# Patient Record
Sex: Male | Born: 1985 | Race: White | Hispanic: No | Marital: Married | Smoking: Never smoker
Health system: Southern US, Community
[De-identification: ages and names within clinical notes are randomized; demographics above are authoritative.]

## PROBLEM LIST (undated history)

## (undated) DIAGNOSIS — I10 Essential (primary) hypertension: Secondary | ICD-10-CM

## (undated) DIAGNOSIS — E119 Type 2 diabetes mellitus without complications: Secondary | ICD-10-CM

## (undated) DIAGNOSIS — E7800 Pure hypercholesterolemia, unspecified: Secondary | ICD-10-CM

## (undated) HISTORY — PX: ANKLE SURGERY: SHX546

---

## 2017-06-17 ENCOUNTER — Encounter (HOSPITAL_COMMUNITY): Payer: Self-pay | Admitting: Emergency Medicine

## 2017-06-17 ENCOUNTER — Emergency Department (HOSPITAL_COMMUNITY): Payer: Medicaid - Out of State

## 2017-06-17 ENCOUNTER — Emergency Department (HOSPITAL_COMMUNITY)
Admission: EM | Admit: 2017-06-17 | Discharge: 2017-06-17 | Discharge status: Against Medical Advice | Payer: Self-pay | Attending: Emergency Medicine | Admitting: Emergency Medicine

## 2017-06-17 ENCOUNTER — Emergency Department (HOSPITAL_COMMUNITY)
Admission: EM | Admit: 2017-06-17 | Discharge: 2017-06-17 | Disposition: A | Discharge status: Home / Self Care | Payer: Medicaid - Out of State | Attending: Emergency Medicine | Admitting: Emergency Medicine

## 2017-06-17 DIAGNOSIS — E119 Type 2 diabetes mellitus without complications: Secondary | ICD-10-CM | POA: Insufficient documentation

## 2017-06-17 DIAGNOSIS — R079 Chest pain, unspecified: Secondary | ICD-10-CM | POA: Diagnosis not present

## 2017-06-17 DIAGNOSIS — Z79899 Other long term (current) drug therapy: Secondary | ICD-10-CM | POA: Insufficient documentation

## 2017-06-17 DIAGNOSIS — I10 Essential (primary) hypertension: Secondary | ICD-10-CM | POA: Insufficient documentation

## 2017-06-17 DIAGNOSIS — Z5321 Procedure and treatment not carried out due to patient leaving prior to being seen by health care provider: Secondary | ICD-10-CM | POA: Insufficient documentation

## 2017-06-17 DIAGNOSIS — G43809 Other migraine, not intractable, without status migrainosus: Secondary | ICD-10-CM

## 2017-06-17 DIAGNOSIS — F17220 Nicotine dependence, chewing tobacco, uncomplicated: Secondary | ICD-10-CM | POA: Insufficient documentation

## 2017-06-17 HISTORY — DX: Pure hypercholesterolemia, unspecified: E78.00

## 2017-06-17 HISTORY — DX: Essential (primary) hypertension: I10

## 2017-06-17 HISTORY — DX: Type 2 diabetes mellitus without complications: E11.9

## 2017-06-17 LAB — CBC
HEMATOCRIT: 43.3 % (ref 39.0–52.0)
Hemoglobin: 15.4 g/dL (ref 13.0–17.0)
MCH: 30.8 pg (ref 26.0–34.0)
MCHC: 35.6 g/dL (ref 30.0–36.0)
MCV: 86.6 fL (ref 78.0–100.0)
Platelets: 293 10*3/uL (ref 150–400)
RBC: 5 MIL/uL (ref 4.22–5.81)
RDW: 13.2 % (ref 11.5–15.5)
WBC: 10.7 10*3/uL — ABNORMAL HIGH (ref 4.0–10.5)

## 2017-06-17 LAB — BASIC METABOLIC PANEL
Anion gap: 10 (ref 5–15)
BUN: 14 mg/dL (ref 6–20)
CHLORIDE: 101 mmol/L (ref 101–111)
CO2: 22 mmol/L (ref 22–32)
Calcium: 9.1 mg/dL (ref 8.9–10.3)
Creatinine, Ser: 0.82 mg/dL (ref 0.61–1.24)
GFR calc Af Amer: >60 mL/min (ref >60)
GLUCOSE: 236 mg/dL — AB (ref 65–99)
POTASSIUM: 3.4 mmol/L — AB (ref 3.5–5.1)
Sodium: 133 mmol/L — ABNORMAL LOW (ref 135–145)

## 2017-06-17 LAB — TROPONIN I: Troponin I: <0.03 ng/mL (ref <0.03)

## 2017-06-17 MED ORDER — DIPHENHYDRAMINE HCL 50 MG/ML IJ SOLN
25.0000 mg | Freq: Once | INTRAMUSCULAR | Status: AC
  Administered 2017-06-17: 25 mg via INTRAVENOUS
  Filled 2017-06-17: qty 1

## 2017-06-17 MED ORDER — PROCHLORPERAZINE EDISYLATE 5 MG/ML IJ SOLN
10.0000 mg | Freq: Once | INTRAMUSCULAR | Status: AC
  Administered 2017-06-17: 10 mg via INTRAVENOUS
  Filled 2017-06-17: qty 2

## 2017-06-17 MED ORDER — KETOROLAC TROMETHAMINE 30 MG/ML IJ SOLN
30.0000 mg | Freq: Once | INTRAMUSCULAR | Status: AC
  Administered 2017-06-17: 30 mg via INTRAVENOUS
  Filled 2017-06-17: qty 1

## 2017-06-17 NOTE — Discharge Instructions (Signed)
Follow up with your primary care doctor, return as needed for worsening symptoms °

## 2017-06-17 NOTE — ED Triage Notes (Addendum)
Patient complaining of headache x 7 days, today radiating down right side of neck into chest area. Patient states he has been out of his high blood pressure, cholesterol, and diabetes medication.

## 2017-06-17 NOTE — ED Notes (Signed)
Pt alert & oriented x4, stable gait. Patient given discharge instructions, paperwork & prescription(s). Patient  instructed to stop at the registration desk to finish any additional paperwork. Patient verbalized understanding. Pt left department w/ no further questions. 

## 2017-06-17 NOTE — ED Provider Notes (Signed)
[facility]PT Provider Note   CSN: [id number] Arrival date & time: 06/17/17  1655     History   Chief Complaint Chief Complaint  Patient presents with  . Chest Pain    HPI Jeffrey Collier is a 31 y.o. male.  HPI Patient presents to the emergency room for evaluation primarily migraine headache however he also developed some chest pain today. Patient states he's had a migraine headache off and on for the last several days. Yesterday he took some Excedrin which resolved the headache but it returned again about 6 hours. She denies any nausea vomiting. No trouble with his speech. No trouble with balance or coordination. No fevers or chills.  Today however he developed pain in his chest that radiated to his jaw. He does have a history of diabetes hypertension and hypercholesterol. He denies any history of heart disease.  No history of PE or DVT.  Nursing notes indicates the patient's out of his medications however the patient tells me he does have his medications and has been taking them. Past Medical History:  Diagnosis Date  . Diabetes mellitus without complication (HCC)   . High cholesterol   . Hypertension     There are no active problems to display for this patient.   Past Surgical History:  Procedure Laterality Date  . ANKLE SURGERY         Home Medications    Prior to Admission medications   Not on File    Family History History reviewed. No pertinent family history.  Social History Social History  Substance Use Topics  . Smoking status: Never Smoker  . Smokeless tobacco: Current User    Types: Chew  . Alcohol use No     Allergies   Patient has no known allergies.   Review of Systems Review of Systems  All other systems reviewed and are negative.    Physical Exam Updated Vital Signs BP (!) 148/86   Pulse 85   Temp 98.5 F (36.9 C) (Oral)   Resp (!) 21   Ht 1.803 m (5\' 11" )   Wt 129.3 kg (285 lb)   SpO2 95%   BMI 39.75 kg/m    Physical Exam  Constitutional: He appears well-developed and well-nourished. No distress.  HENT:  Head: Normocephalic and atraumatic.  Right Ear: External ear normal.  Left Ear: External ear normal.  Eyes: Conjunctivae are normal. Right eye exhibits no discharge. Left eye exhibits no discharge. No scleral icterus.  Neck: Normal range of motion. Neck supple. No tracheal deviation present.  Cardiovascular: Normal rate, regular rhythm and intact distal pulses.   Pulmonary/Chest: Effort normal and breath sounds normal. No stridor. No respiratory distress. He has no wheezes. He has no rales.  Abdominal: Soft. Bowel sounds are normal. He exhibits no distension. There is no tenderness. There is no rebound and no guarding.  Musculoskeletal: He exhibits no edema or tenderness.  Lymphadenopathy:    He has no cervical adenopathy.  Neurological: He is alert. He has normal strength. No cranial nerve deficit (no facial droop, extraocular movements intact, no slurred speech) or sensory deficit. He exhibits normal muscle tone. He displays no seizure activity. Coordination normal.  Skin: Skin is warm and dry. No rash noted.  Psychiatric: He has a normal mood and affect.  Nursing note and vitals reviewed.    ED Treatments / Results  Labs (all labs ordered are listed, but only abnormal results are displayed) Labs Reviewed  BASIC METABOLIC PANEL - Abnormal; Notable for the  following:       Result Value   Sodium 133 (*)    Potassium 3.4 (*)    Glucose, Bld 236 (*)    All other components within normal limits  CBC - Abnormal; Notable for the following:    WBC 10.7 (*)    All other components within normal limits  TROPONIN I    EKG  EKG Interpretation  Date/Time:  Monday June 17 2017 17:07:18 EDT Ventricular Rate:  101 PR Interval:  118 QRS Duration: 94 QT Interval:  332 QTC Calculation: 430 R Axis:   38 Text Interpretation:  Sinus tachycardia Nonspecific T wave abnormality Abnormal ECG  No old tracing to compare Confirmed by Linwood Dibbles [phone]) on 06/17/2017 8:20:28 PM       Radiology Dg Chest 2 View  Result Date: 06/17/2017 CLINICAL DATA:  Mid chest pain x2 hours EXAM: CHEST  2 VIEW COMPARISON:  None. FINDINGS: Lungs are clear.  No pleural effusion or pneumothorax. The heart is normal in size. Visualized osseous structures are within normal limits. IMPRESSION: Normal chest radiographs. Electronically Signed   By: Charline Bills M.D.   On: 06/17/2017 17:40    Procedures Procedures (including critical care time)  Medications Ordered in ED Medications  prochlorperazine (COMPAZINE) injection 10 mg (10 mg Intravenous Given 06/17/17 2143)  diphenhydrAMINE (BENADRYL) injection 25 mg (25 mg Intravenous Given 06/17/17 2144)  ketorolac (TORADOL) 30 MG/ML injection 30 mg (30 mg Intravenous Given 06/17/17 2145)     Initial Impression / Assessment and Plan / ED Course  I have reviewed the triage vital signs and the nursing notes.  Pertinent labs & imaging results that were available during my care of the patient were reviewed by me and considered in my medical decision making (see chart for details).   patient presented with complaints of headache and chest pain. Patient's symptoms are atypical for acute coronary syndrome. Heart score is 1. I doubt ACS as the cause of his chest pain today.  Primary complaint is migraine-type headache. Normal neuro exam.  No concerning sx. He was treated with a migraine cocktail. Symptoms have resolved and the patient feels ready to go home.  Final Clinical Impressions(s) / ED Diagnoses   Final diagnoses:  Other migraine without status migrainosus, not intractable  Chest pain, unspecified type    New Prescriptions New Prescriptions   No medications on file     Linwood Dibbles, MD 06/17/17 2249

## 2017-06-30 ENCOUNTER — Encounter (HOSPITAL_COMMUNITY): Payer: Self-pay | Admitting: Cardiology

## 2017-06-30 ENCOUNTER — Emergency Department (HOSPITAL_COMMUNITY): Payer: Medicaid - Out of State

## 2017-06-30 ENCOUNTER — Emergency Department (HOSPITAL_COMMUNITY)
Admission: EM | Admit: 2017-06-30 | Discharge: 2017-06-30 | Disposition: A | Discharge status: Home / Self Care | Payer: Medicaid - Out of State | Attending: Emergency Medicine | Admitting: Emergency Medicine

## 2017-06-30 DIAGNOSIS — Y939 Activity, unspecified: Secondary | ICD-10-CM | POA: Insufficient documentation

## 2017-06-30 DIAGNOSIS — M86672 Other chronic osteomyelitis, left ankle and foot: Secondary | ICD-10-CM | POA: Diagnosis not present

## 2017-06-30 DIAGNOSIS — F17220 Nicotine dependence, chewing tobacco, uncomplicated: Secondary | ICD-10-CM | POA: Diagnosis not present

## 2017-06-30 DIAGNOSIS — I10 Essential (primary) hypertension: Secondary | ICD-10-CM | POA: Diagnosis not present

## 2017-06-30 DIAGNOSIS — Y999 Unspecified external cause status: Secondary | ICD-10-CM | POA: Diagnosis not present

## 2017-06-30 DIAGNOSIS — S93402A Sprain of unspecified ligament of left ankle, initial encounter: Secondary | ICD-10-CM | POA: Diagnosis not present

## 2017-06-30 DIAGNOSIS — E119 Type 2 diabetes mellitus without complications: Secondary | ICD-10-CM | POA: Insufficient documentation

## 2017-06-30 DIAGNOSIS — M8668 Other chronic osteomyelitis, other site: Secondary | ICD-10-CM

## 2017-06-30 DIAGNOSIS — Y929 Unspecified place or not applicable: Secondary | ICD-10-CM | POA: Diagnosis not present

## 2017-06-30 DIAGNOSIS — W19XXXA Unspecified fall, initial encounter: Secondary | ICD-10-CM | POA: Insufficient documentation

## 2017-06-30 DIAGNOSIS — S99912A Unspecified injury of left ankle, initial encounter: Secondary | ICD-10-CM | POA: Diagnosis present

## 2017-06-30 LAB — CBC WITH DIFFERENTIAL/PLATELET
BASOS ABS: 0 10*3/uL (ref 0.0–0.1)
BASOS PCT: 0 %
EOS ABS: 0.1 10*3/uL (ref 0.0–0.7)
EOS PCT: 1 %
HCT: 43.7 % (ref 39.0–52.0)
Hemoglobin: 15.7 g/dL (ref 13.0–17.0)
Lymphocytes Relative: 31 %
Lymphs Abs: 3.3 10*3/uL (ref 0.7–4.0)
MCH: 31.2 pg (ref 26.0–34.0)
MCHC: 35.9 g/dL (ref 30.0–36.0)
MCV: 86.9 fL (ref 78.0–100.0)
Monocytes Absolute: 0.6 10*3/uL (ref 0.1–1.0)
Monocytes Relative: 6 %
Neutro Abs: 6.6 10*3/uL (ref 1.7–7.7)
Neutrophils Relative %: 62 %
PLATELETS: 295 10*3/uL (ref 150–400)
RBC: 5.03 MIL/uL (ref 4.22–5.81)
RDW: 13 % (ref 11.5–15.5)
WBC: 10.6 10*3/uL — ABNORMAL HIGH (ref 4.0–10.5)

## 2017-06-30 LAB — BASIC METABOLIC PANEL
ANION GAP: 11 (ref 5–15)
BUN: 11 mg/dL (ref 6–20)
CALCIUM: 9.2 mg/dL (ref 8.9–10.3)
CO2: 22 mmol/L (ref 22–32)
CREATININE: 0.77 mg/dL (ref 0.61–1.24)
Chloride: 103 mmol/L (ref 101–111)
GFR calc Af Amer: >60 mL/min (ref >60)
GLUCOSE: 247 mg/dL — AB (ref 65–99)
Potassium: 3.8 mmol/L (ref 3.5–5.1)
Sodium: 136 mmol/L (ref 135–145)

## 2017-06-30 LAB — CBG MONITORING, ED: GLUCOSE-CAPILLARY: 258 mg/dL — AB (ref 65–99)

## 2017-06-30 MED ORDER — IBUPROFEN 600 MG PO TABS: 600.0000 mg | ORAL_TABLET | Freq: Four times a day (QID) | ORAL | 0 refills | Status: DC | PRN

## 2017-06-30 MED ORDER — AMOXICILLIN-POT CLAVULANATE 875-125 MG PO TABS: 1.0000 | ORAL_TABLET | Freq: Two times a day (BID) | ORAL | 0 refills | Status: DC

## 2017-06-30 MED ORDER — SULFAMETHOXAZOLE-TRIMETHOPRIM 800-160 MG PO TABS
1.0000 | ORAL_TABLET | Freq: Once | ORAL | Status: AC
Start: 2017-06-30 — End: 2017-06-30
  Administered 2017-06-30: 1 via ORAL
  Filled 2017-06-30: qty 1

## 2017-06-30 MED ORDER — AMOXICILLIN-POT CLAVULANATE 875-125 MG PO TABS
1.0000 | ORAL_TABLET | Freq: Once | ORAL | Status: AC
  Administered 2017-06-30: 1 via ORAL
  Filled 2017-06-30: qty 1

## 2017-06-30 MED ORDER — IBUPROFEN 800 MG PO TABS
800.0000 mg | ORAL_TABLET | Freq: Once | ORAL | Status: AC
  Administered 2017-06-30: 800 mg via ORAL
  Filled 2017-06-30: qty 1

## 2017-06-30 NOTE — Discharge Instructions (Signed)
As discussed, you are leaving against our advice since the ideal treatment of your toe infection would include IV antibiotics.  You need close followup for this problem and your may need to have a PICC line placed as you did when you had this infection in January.  Call Dr. Ave Filterhandler (orthopedist) for a close follow up of this infection. You have also been referred to a local primary doctor to establish primary care.  You need to start taking your metformin.

## 2017-06-30 NOTE — ED Notes (Signed)
Please note left great toe.  Left great toe is red, swollen and warm to touch.  Pt is diabetic.  Last meal yesterday at 2200.

## 2017-06-30 NOTE — ED Provider Notes (Signed)
[facility] Provider Note   CSN: [id number] Arrival date & time: 06/30/17  1032     History   Chief Complaint Chief Complaint  Patient presents with  . Ankle Pain    HPI Jeffrey Collier is a 31 y.o. male  With a history of DM with noncompliance (has metformin, just doesn't take it) and osteomyelitis of his left great toe with left ankle pain which occurred suddenly when the patient rolled his ankle 3 days ago.  Pain is aching, constant and worse with palpation, movement and weight bearing.  The patient was able to weight bear immediately after the event.  There is no radiation of pain and the patient denies numbness distal to the injury site.  The patients treatment prior to arrival included rest, ice and nsaids.  He does endorse he underwent a 6 week treatment of antibiotics through a PICC line when living in [facility] for left great toe osteomyelitis, finishing tx  8 months ago but reports the toe started to swell and get red again 2 months ago. He has had no evaluation of this recurrence.      HPI  Past Medical History:  Diagnosis Date  . Diabetes mellitus without complication (HCC)   . High cholesterol   . Hypertension     There are no active problems to display for this patient.   Past Surgical History:  Procedure Laterality Date  . ANKLE SURGERY         Home Medications    Prior to Admission medications   Medication Sig Start Date End Date Taking? Authorizing Provider  amoxicillin-clavulanate (AUGMENTIN) 875-125 MG tablet Take 1 tablet by mouth every 12 (twelve) hours. 06/30/17   Burgess Amor, PA-C  ibuprofen (ADVIL,MOTRIN) 600 MG tablet Take 1 tablet (600 mg total) by mouth every 6 (six) hours as needed. 06/30/17   Burgess Amor, PA-C    Family History History reviewed. No pertinent family history.  Social History Social History  Substance Use Topics  . Smoking status: Never Smoker  . Smokeless tobacco: Current User    Types: Chew  . Alcohol use No      Allergies   Patient has no known allergies.   Review of Systems Review of Systems  Musculoskeletal: Positive for arthralgias and joint swelling.  Skin: Positive for color change. Negative for wound.  Neurological: Negative for weakness and numbness.     Physical Exam Updated Vital Signs BP 123/75 (BP Location: Left Arm)   Pulse 81   Temp 98.2 F (36.8 C) (Oral)   Resp 16   Ht 5\' 11"  (1.803 m)   Wt 129.3 kg (285 lb)   SpO2 98%   BMI 39.75 kg/m   Physical Exam  Constitutional: He appears well-developed and well-nourished.  HENT:  Head: Normocephalic.  Cardiovascular: Normal rate and intact distal pulses.  Exam reveals no decreased pulses.   Pulses:      Dorsalis pedis pulses are 2+ on the right side, and 2+ on the left side.       Posterior tibial pulses are 2+ on the right side, and 2+ on the left side.  Musculoskeletal: He exhibits edema and tenderness.       Right ankle: Normal.       Left ankle: He exhibits swelling. He exhibits no deformity and normal pulse. Tenderness. Lateral malleolus tenderness found. No head of 5th metatarsal and no proximal fibula tenderness found. Achilles tendon normal.  Healed lateral ankle surgical incision. Bruising at the site. No edema.  Neurological: He is alert. No sensory deficit.  Skin: Skin is warm, dry and intact. There is erythema.  Erythema and edema of left great toe. Distal cap refill less than 2 sec. No ttp.  Nursing note and vitals reviewed.    ED Treatments / Results  Labs (all labs ordered are listed, but only abnormal results are displayed) Labs Reviewed  CBC WITH DIFFERENTIAL/PLATELET - Abnormal; Notable for the following:       Result Value   WBC 10.6 (*)    All other components within normal limits  BASIC METABOLIC PANEL - Abnormal; Notable for the following:    Glucose, Bld 247 (*)    All other components within normal limits  CBG MONITORING, ED - Abnormal; Notable for the following:     Glucose-Capillary 258 (*)    All other components within normal limits  CBG MONITORING, ED    EKG  EKG Interpretation None       Radiology Dg Ankle Complete Left  Result Date: 06/30/2017 CLINICAL DATA:  Left ankle pain following an injury 3 days ago. EXAM: LEFT ANKLE COMPLETE - 3+ VIEW COMPARISON:  None. FINDINGS: There are 4 screws in the distal tibia. There is also a small exostosis arising from the medial aspect of the distal tibia. No fracture, dislocation or effusion seen. IMPRESSION: No fracture.  Small distal tibial exostosis. Electronically Signed   By: Beckie SaltsSteven  Reid M.D.   On: 06/30/2017 11:20   Dg Foot Complete Left  Result Date: 06/30/2017 CLINICAL DATA:  Left toe pain, recent left foot injury EXAM: LEFT FOOT - COMPLETE 3+ VIEW COMPARISON:  None. FINDINGS: Soft tissue swelling in the distal left first toe. Multiple small cortical erosions involving the distal tuft of the distal phalanx in the left first toe. No fracture. No dislocation. No suspicious focal osseous lesion. Multiple intact screws are seen in the left distal tibia. Vascular calcifications in the soft tissues. IMPRESSION: Soft tissue swelling in the distal left first toe with multiple small cortical erosions involving the distal tuft of the distal phalanx in the left first toe, compatible with acute osteomyelitis. Electronically Signed   By: Delbert PhenixJason A Poff M.D.   On: 06/30/2017 12:37    Procedures Procedures (including critical care time)  Medications Ordered in ED Medications  ibuprofen (ADVIL,MOTRIN) tablet 800 mg (800 mg Oral Given 06/30/17 1221)  sulfamethoxazole-trimethoprim (BACTRIM DS,SEPTRA DS) 800-160 MG per tablet 1 tablet (1 tablet Oral Given 06/30/17 1221)  amoxicillin-clavulanate (AUGMENTIN) 875-125 MG per tablet 1 tablet (1 tablet Oral Given 06/30/17 1325)     Initial Impression / Assessment and Plan / ED Course  I have reviewed the triage vital signs and the nursing notes.  Pertinent labs & imaging  results that were available during my care of the patient were reviewed by me and considered in my medical decision making (see chart for details).     Discussed treatment including admission for IV antibiotics which patient refuses at this time.  He states he cannot miss any work.  He is more concerned about the ankle injury, stating his great toe has been red and swollen for "months".  Imaging reviewed with patient showing him the area of osteomyelitis.  He does agree to close outpatient follow-up but again does not wish to be admitted to the hospital.  Discussed that he may need to have a PICC line placement again for IV antibiotics, he was referred to Dr. Lula Olszewskihamberlain with orthopedics, also given primary care follow-up.  Locally as he is  recently moved to this area and has no local providers.  He states he does have lots of metformin at home, he just doesn't like to take it.  He was encouraged to make sure he is taking his medication to get his blood sugars under better control.  He was placed on Augmentin.  He was given a ASO and crutches for his left ankle sprain.  He will call Dr. Ave Filter Tuesday morning for office follow-up this week.  Final Clinical Impressions(s) / ED Diagnoses   Final diagnoses:  Sprain of left ankle, unspecified ligament, initial encounter  Other chronic osteomyelitis, other site [facility])    New Prescriptions Discharge Medication List as of 06/30/2017  2:18 PM    START taking these medications   Details  amoxicillin-clavulanate (AUGMENTIN) 875-125 MG tablet Take 1 tablet by mouth every 12 (twelve) hours., Starting Sun 06/30/2017, Print    ibuprofen (ADVIL,MOTRIN) 600 MG tablet Take 1 tablet (600 mg total) by mouth every 6 (six) hours as needed., Starting Sun 06/30/2017, Print         Burgess Amor, PA-C 06/30/17 1427    Linwood Dibbles, MD 07/03/17 1116

## 2017-06-30 NOTE — ED Triage Notes (Signed)
Rolled left foot pain 3 days ago.  C/o pain to left ankle

## 2017-07-02 ENCOUNTER — Ambulatory Visit (INDEPENDENT_AMBULATORY_CARE_PROVIDER_SITE_OTHER): Payer: BLUE CROSS/BLUE SHIELD

## 2017-07-02 ENCOUNTER — Ambulatory Visit (INDEPENDENT_AMBULATORY_CARE_PROVIDER_SITE_OTHER): Payer: BLUE CROSS/BLUE SHIELD | Admitting: Orthopaedic Surgery

## 2017-07-02 DIAGNOSIS — L600 Ingrowing nail: Secondary | ICD-10-CM

## 2017-07-02 NOTE — Progress Notes (Signed)
   Office Visit Note   Patient: Jeffrey PuntJustin Collier           Date of Birth: 01/31/1986           MRN: [id number] Visit Date: 07/02/2017              Requested by: No referring provider defined for this encounter. PCP: System, Pcp Not In   Assessment & Plan: Visit Diagnoses:  1. Ingrown toenail     Plan: MRI of the left great toe to rule out osteomyelitis. I stressed the importance of good diabetic control and tobacco cessation. Out of work for a week. Cam walker boot for support and mobilization. Follow-up after the MRI.  Follow-Up Instructions: Return if symptoms worsen or fail to improve.   Orders:  Orders Placed This Encounter  Procedures  . XR Toe Great Left  . MR FOOT LEFT W CONTRAST   No orders of the defined types were placed in this encounter.     Procedures: No procedures performed   Clinical Data: No additional findings.   Subjective: Chief Complaint  Patient presents with  . Left Ankle - Pain, Follow-up  . Left Foot - Pain, Follow-up  . Left Great Toe - Ingrown Toenail    Patient is a 31 year old gentleman who comes in with a left great toe ingrown toenail. He is had issues with this before. He states that he had a MRI back in [place] which showed he had osteomyelitis which required admission and IV antibiotics. He is a poorly controlled diabetic. He is currently on Augmentin. He has had significant pain with walking. He takes ibuprofen. Denies any numbness or tingling he does chew tobacco.    Review of Systems  Constitutional: Negative.   All other systems reviewed and are negative.    Objective: Vital Signs: There were no vitals taken for this visit.  Physical Exam  Constitutional: He is oriented to person, place, and time. He appears well-developed and well-nourished.  HENT:  Head: Normocephalic and atraumatic.  Eyes: Pupils are equal, round, and reactive to light.  Neck: Neck supple.  Pulmonary/Chest: Effort normal.  Abdominal: Soft.    Musculoskeletal: Normal range of motion.  Neurological: He is alert and oriented to person, place, and time.  Skin: Skin is warm.  Psychiatric: He has a normal mood and affect. His behavior is normal. Judgment and thought content normal.  Nursing note and vitals reviewed.   Ortho Exam Left great toe exam shows dried drainage consistent with a ingrown toenail. There is erythema and swelling of the great toe. Specialty Comments:  No specialty comments available.  Imaging: Xr Toe Great Left  Result Date: 07/02/2017 Spurring of distal phalanx of great toe without any definitive evidence of osteomyelitis    PMFS History: There are no active problems to display for this patient.  Past Medical History:  Diagnosis Date  . Diabetes mellitus without complication (HCC)   . High cholesterol   . Hypertension     No family history on file.  Past Surgical History:  Procedure Laterality Date  . ANKLE SURGERY     Social History   Occupational History  . Not on file.   Social History Main Topics  . Smoking status: Never Smoker  . Smokeless tobacco: Current User    Types: Chew  . Alcohol use No  . Drug use: No  . Sexual activity: Not on file

## 2017-07-04 ENCOUNTER — Inpatient Hospital Stay: Admission: RE | Admit: 2017-07-04 | Payer: Medicaid - Out of State | Source: Ambulatory Visit

## 2017-12-10 ENCOUNTER — Encounter (HOSPITAL_COMMUNITY): Payer: Self-pay | Admitting: Emergency Medicine

## 2017-12-10 ENCOUNTER — Emergency Department (HOSPITAL_COMMUNITY): Payer: Medicaid - Out of State

## 2017-12-10 ENCOUNTER — Other Ambulatory Visit: Payer: Self-pay

## 2017-12-10 DIAGNOSIS — L233 Allergic contact dermatitis due to drugs in contact with skin: Secondary | ICD-10-CM | POA: Insufficient documentation

## 2017-12-10 DIAGNOSIS — I10 Essential (primary) hypertension: Secondary | ICD-10-CM | POA: Insufficient documentation

## 2017-12-10 DIAGNOSIS — E7800 Pure hypercholesterolemia, unspecified: Secondary | ICD-10-CM | POA: Insufficient documentation

## 2017-12-10 DIAGNOSIS — Z79899 Other long term (current) drug therapy: Secondary | ICD-10-CM | POA: Insufficient documentation

## 2017-12-10 DIAGNOSIS — E11620 Type 2 diabetes mellitus with diabetic dermatitis: Secondary | ICD-10-CM | POA: Insufficient documentation

## 2017-12-10 DIAGNOSIS — Z7984 Long term (current) use of oral hypoglycemic drugs: Secondary | ICD-10-CM | POA: Insufficient documentation

## 2017-12-10 NOTE — ED Triage Notes (Signed)
Pt c/o right sided chest pain that radiates to the back and hives since last night. Pt states at times the pain radiates to the left chest.

## 2017-12-11 ENCOUNTER — Emergency Department (HOSPITAL_COMMUNITY)
Admission: EM | Admit: 2017-12-11 | Discharge: 2017-12-11 | Disposition: A | Discharge status: Home / Self Care | Payer: Medicaid - Out of State | Attending: Emergency Medicine | Admitting: Emergency Medicine

## 2017-12-11 DIAGNOSIS — R0789 Other chest pain: Secondary | ICD-10-CM

## 2017-12-11 DIAGNOSIS — E11620 Type 2 diabetes mellitus with diabetic dermatitis: Secondary | ICD-10-CM

## 2017-12-11 DIAGNOSIS — L270 Generalized skin eruption due to drugs and medicaments taken internally: Secondary | ICD-10-CM

## 2017-12-11 DIAGNOSIS — T3795XA Adverse effect of unspecified systemic anti-infective and antiparasitic, initial encounter: Secondary | ICD-10-CM

## 2017-12-11 LAB — D-DIMER, QUANTITATIVE: D-Dimer, Quant: <0.27 ug/mL-FEU (ref 0.00–0.50)

## 2017-12-11 LAB — CBC
HEMATOCRIT: 45.9 % (ref 39.0–52.0)
HEMOGLOBIN: 16 g/dL (ref 13.0–17.0)
MCH: 30.2 pg (ref 26.0–34.0)
MCHC: 34.9 g/dL (ref 30.0–36.0)
MCV: 86.6 fL (ref 78.0–100.0)
Platelets: 331 10*3/uL (ref 150–400)
RBC: 5.3 MIL/uL (ref 4.22–5.81)
RDW: 12.7 % (ref 11.5–15.5)
WBC: 10 10*3/uL (ref 4.0–10.5)

## 2017-12-11 LAB — BASIC METABOLIC PANEL
ANION GAP: 14 (ref 5–15)
BUN: 10 mg/dL (ref 6–20)
CALCIUM: 9.3 mg/dL (ref 8.9–10.3)
CHLORIDE: 97 mmol/L — AB (ref 101–111)
CO2: 21 mmol/L — AB (ref 22–32)
Creatinine, Ser: 0.88 mg/dL (ref 0.61–1.24)
GFR calc non Af Amer: >60 mL/min (ref >60)
Glucose, Bld: 198 mg/dL — ABNORMAL HIGH (ref 65–99)
POTASSIUM: 3.6 mmol/L (ref 3.5–5.1)
Sodium: 132 mmol/L — ABNORMAL LOW (ref 135–145)

## 2017-12-11 LAB — TROPONIN I

## 2017-12-11 MED ORDER — CLINDAMYCIN HCL 150 MG PO CAPS
300.0000 mg | ORAL_CAPSULE | Freq: Once | ORAL | Status: AC
  Administered 2017-12-11: 300 mg via ORAL
  Filled 2017-12-11: qty 2

## 2017-12-11 MED ORDER — CLINDAMYCIN HCL 300 MG PO CAPS: 300.0000 mg | ORAL_CAPSULE | Freq: Four times a day (QID) | ORAL | 0 refills | Status: DC

## 2017-12-11 NOTE — Discharge Instructions (Signed)
Stop taking Bactrim. Always tell a health care provider prescribing medication for you that you are allergic to Bactrim.  Take Claritin or Zyrtec once a day. Take Benadryl as needed for itching.  Follow up with your podiatrist as scheduled.

## 2017-12-11 NOTE — ED Provider Notes (Signed)
[facility] EMERGENCY DEPARTMENT Provider Note   CSN: [id number] Arrival date & time: 12/10/17  2049     History   Chief Complaint Chief Complaint  Patient presents with  . Chest Pain    HPI Jeffrey Collier is a 32 y.o. male.  The history is provided by the patient.  Chest Pain    He has history diabetes, hypertension, hyperlipidemia and comes in with onset yesterday of chest discomfort.  He describes a pulling sensation in the right side of his chest which radiated to the central chest and sometimes to the back.  When it went to the back, he felt like he had a stretch his muscles.  Today, the discomfort is mainly centered in the mid chest with some radiation to the left.  Nothing seems to make it better and nothing seems to make it worse.  Specifically, there is no relation to taking a deep breath, movement, exertion, body position, eating.  The pain is rated at 4/10 at rest, but sometimes will increase to 8/10.  He has not done anything to treat the pain.  He also noted a rash in the lower abdomen and in the right axilla which is pruritic.  That started about the same time as the chest discomfort.  He has been on trimethoprim-sulfamethoxazole for the last 2 weeks to treat osteomyelitis in the left first toe.  He is scheduled to see his podiatrist again in 2 weeks.  He is a non-smoker but does chew tobacco.  Also, he has had a trip to the beach about 2 weeks ago which involved a 4 Hour Dr. in the car each direction.  Past Medical History:  Diagnosis Date  . Diabetes mellitus without complication (HCC)   . High cholesterol   . Hypertension     There are no active problems to display for this patient.   Past Surgical History:  Procedure Laterality Date  . ANKLE SURGERY         Home Medications    Prior to Admission medications   Medication Sig Start Date End Date Taking? Authorizing Provider  amoxicillin-clavulanate (AUGMENTIN) 875-125 MG tablet Take 1 tablet by mouth every  12 (twelve) hours. 06/30/17   Idol, Raynelle Fanning, PA-C  glipiZIDE (GLUCOTROL) 10 MG tablet Take 10 mg by mouth daily before breakfast.    [provider]  ibuprofen (ADVIL,MOTRIN) 600 MG tablet Take 1 tablet (600 mg total) by mouth every 6 (six) hours as needed. 06/30/17   Burgess Amor, PA-C  lisinopril-hydrochlorothiazide (PRINZIDE,ZESTORETIC) 20-25 MG tablet Take 1 tablet by mouth daily.    [provider]  metFORMIN (GLUCOPHAGE) 1000 MG tablet Take 2,000 mg by mouth 2 (two) times daily with a meal.    [provider]  metoprolol tartrate (LOPRESSOR) 50 MG tablet Take 50 mg by mouth 2 (two) times daily.    [provider]    Family History History reviewed. No pertinent family history.  Social History Social History   Tobacco Use  . Smoking status: Never Smoker  . Smokeless tobacco: Current User    Types: Chew  Substance Use Topics  . Alcohol use: No  . Drug use: No     Allergies   Patient has no known allergies.   Review of Systems Review of Systems  Cardiovascular: Positive for chest pain.  All other systems reviewed and are negative.    Physical Exam Updated Vital Signs BP (!) 144/99   Pulse (!) 114   Temp 98.4 F (36.9  C)   Resp 17   Ht 5\' 11"  (1.803 m)   Wt 129.3 kg (285 lb)   SpO2 96%   BMI 39.75 kg/m   Physical Exam  Nursing note and vitals reviewed.  32 year old male, resting comfortably and in no acute distress. Vital signs are significant for elevated blood pressure and tachycardia. Oxygen saturation is 96%, which is normal. Head is normocephalic and atraumatic. PERRLA, EOMI. Oropharynx is clear. Neck is nontender and supple without adenopathy or JVD. Back is nontender and there is no CVA tenderness. Lungs are clear without rales, wheezes, or rhonchi. Chest is nontender. Heart has regular rate and rhythm without murmur. Abdomen is soft, flat, nontender without masses or hepatosplenomegaly and peristalsis is  normoactive. Extremities have no cyanosis or edema.  Status post removal of left first toenail with some slight drainage at the nail bed.  There is also a lesion in the right anterior lower leg which has been present for several years and is gradually enlarging.  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 Skin is warm and dry.  Urticarial rash is seen across the lower abdomen and in the right axilla. Neurologic: Mental status is normal, cranial nerves are intact, there are no motor or sensory deficits.  ED Treatments / Results  Labs (all labs ordered are listed, but only abnormal results are displayed) Labs Reviewed  BASIC METABOLIC PANEL - Abnormal; Notable for the following components:      Result Value   Sodium 132 (*)    Chloride 97 (*)    CO2 21 (*)    Glucose, Bld 198 (*)    All other components within normal limits  CBC  TROPONIN I  D-DIMER, QUANTITATIVE (NOT AT [facility])    EKG Sinus tachycardia 117 bpm.  Normal axis.  Normal intervals.  Nonspecific T wave changes.  When compared with ECG from June 17, 2017, no significant changes are seen.  Radiology Dg Chest 2 View  Result Date: 12/10/2017 CLINICAL DATA:  Right chest pain starting yesterday and extending to the left side today. Shortness of breath. EXAM: CHEST  2 VIEW COMPARISON:  06/17/2017 FINDINGS: Low lung volumes are present, causing crowding of the pulmonary vasculature. New indistinct nodularity at the left lung base measuring about 1.3 by 0.9 cm. The lungs appear otherwise clear. Cardiac and mediastinal margins appear normal.  No pleural effusion. IMPRESSION: 1. Indistinct nodularity at the left lung base was not readily apparent on the prior exam. Probably artifactual or due to mild atelectasis based on the low lung volumes and the patient's age. Early pneumonia or atypical infectious process is a less likely differential diagnostic consideration. Neoplastic process highly unlikely given the  patient's age and nonsmoking status. 2. Low lung volumes. Electronically Signed   By: Gaylyn Rong M.D.   On: 12/10/2017 21:31    Procedures Procedures (including critical care time)  Medications Ordered in ED Medications  clindamycin (CLEOCIN) capsule 300 mg (300 mg Oral Given 12/11/17 0300)     Initial Impression / Assessment and Plan / ED Course  I have reviewed the triage vital signs and the nursing notes.  Pertinent labs & imaging results that were available during my care of the patient were reviewed by me and considered in my medical decision making (see chart for details).  Urticaria and atypical chest pain.  Given temporal relationship to starting on trimethoprim-sulfamethoxazole, I suspect that both are reaction to the sulfa component.  However, with recent long distance car ride, will  screen for pulmonary embolism with d-dimer.  History of osteomyelitis of the left first toe-we will switch from trimethoprim-sulfamethoxazole to clindamycin.  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  Dimer is negative, and patient is discharged.  Final Clinical Impressions(s) / ED Diagnoses   Final diagnoses:  Allergic drug rash due to anti-infective agent  Atypical chest pain  Necrobiosis lipoidica diabeticorum [facility](HCC)    ED Discharge Orders        Ordered    clindamycin (CLEOCIN) 300 MG capsule  4 times daily     12/11/17 0357       Dione BoozeGlick, Reanne Nellums, MD 12/11/17 0401

## 2018-09-13 ENCOUNTER — Emergency Department (HOSPITAL_COMMUNITY): Payer: BLUE CROSS/BLUE SHIELD

## 2018-09-13 ENCOUNTER — Encounter (HOSPITAL_COMMUNITY): Payer: Self-pay

## 2018-09-13 ENCOUNTER — Emergency Department (HOSPITAL_COMMUNITY)
Admission: EM | Admit: 2018-09-13 | Discharge: 2018-09-13 | Disposition: A | Discharge status: Home / Self Care | Payer: BLUE CROSS/BLUE SHIELD | Attending: Emergency Medicine | Admitting: Emergency Medicine

## 2018-09-13 ENCOUNTER — Other Ambulatory Visit: Payer: Self-pay

## 2018-09-13 DIAGNOSIS — Z79899 Other long term (current) drug therapy: Secondary | ICD-10-CM | POA: Insufficient documentation

## 2018-09-13 DIAGNOSIS — I10 Essential (primary) hypertension: Secondary | ICD-10-CM | POA: Diagnosis not present

## 2018-09-13 DIAGNOSIS — R079 Chest pain, unspecified: Secondary | ICD-10-CM

## 2018-09-13 DIAGNOSIS — E119 Type 2 diabetes mellitus without complications: Secondary | ICD-10-CM | POA: Diagnosis not present

## 2018-09-13 DIAGNOSIS — Z7984 Long term (current) use of oral hypoglycemic drugs: Secondary | ICD-10-CM | POA: Diagnosis not present

## 2018-09-13 LAB — BASIC METABOLIC PANEL
Anion gap: 12 (ref 5–15)
BUN: 22 mg/dL — AB (ref 6–20)
CALCIUM: 9.5 mg/dL (ref 8.9–10.3)
CO2: 23 mmol/L (ref 22–32)
CREATININE: 1.02 mg/dL (ref 0.61–1.24)
Chloride: 100 mmol/L (ref 98–111)
GFR calc Af Amer: >60 mL/min (ref >60)
GFR calc non Af Amer: >60 mL/min (ref >60)
GLUCOSE: 248 mg/dL — AB (ref 70–99)
Potassium: 3.8 mmol/L (ref 3.5–5.1)
Sodium: 135 mmol/L (ref 135–145)

## 2018-09-13 LAB — I-STAT TROPONIN, ED: Troponin i, poc: 0 ng/mL (ref 0.00–0.08)

## 2018-09-13 LAB — CBC
HCT: 46.2 % (ref 39.0–52.0)
Hemoglobin: 15.8 g/dL (ref 13.0–17.0)
MCH: 29.5 pg (ref 26.0–34.0)
MCHC: 34.2 g/dL (ref 30.0–36.0)
MCV: 86.4 fL (ref 80.0–100.0)
PLATELETS: 334 10*3/uL (ref 150–400)
RBC: 5.35 MIL/uL (ref 4.22–5.81)
RDW: 12.8 % (ref 11.5–15.5)
WBC: 12.4 10*3/uL — ABNORMAL HIGH (ref 4.0–10.5)
nRBC: 0 % (ref 0.0–0.2)

## 2018-09-13 LAB — TSH: TSH: 0.781 u[IU]/mL (ref 0.350–4.500)

## 2018-09-13 LAB — CBG MONITORING, ED: Glucose-Capillary: 268 mg/dL — ABNORMAL HIGH (ref 70–99)

## 2018-09-13 LAB — TROPONIN I: Troponin I: <0.03 ng/mL (ref <0.03)

## 2018-09-13 NOTE — ED Triage Notes (Addendum)
Pt reports that he was seen at urgent care yesterday for left shoulder numbness/ pain since Wednesday. EKG done. Pt reports that HR 95-120. Pt reports his left  Chest began hurting this morning off and on. Pt reports urgent care was unable to clear him to return to work due to inability to draw enzymes

## 2018-09-13 NOTE — Discharge Instructions (Addendum)
Follow up with your doctor

## 2018-09-13 NOTE — ED Provider Notes (Signed)
[facility] EMERGENCY DEPARTMENT Provider Note   CSN: [id number] Arrival date & time: 09/13/18  1500     History   Chief Complaint Chief Complaint  Patient presents with  . Chest Pain    HPI Jeffrey Collier is a 32 y.o. male.  HPI Patient presents with chest pain.  3 or 4 days ago had some right shoulder pain and numbness.  Then moved to mid chest then to left shoulder and to the scapula.  States that pain in the mid chest will last a few seconds come and going.  It will recur about 10 seconds later.  Last a couple seconds.  It is somewhat sharp.  Not worse with breathing.  No nausea or vomiting.  The pain in the left scapula will also come and go.  Last about 30 seconds then come back every 10 minutes.  Both can come and go whenever they want.  No real cough.  Heart rate normally runs 95-120.  Seen at urgent care yesterday and and sent in here.  Not worse with movement.  He is on metoprolol.  He is diabetic.  States that his cholesterol is off the charts high.  He does not smoke.  He has had a stress test in the last year or 2.  Reportedly was negative.  Has not had heart catheterization. Past Medical History:  Diagnosis Date  . Diabetes mellitus without complication (HCC)   . High cholesterol   . Hypertension     There are no active problems to display for this patient.   Past Surgical History:  Procedure Laterality Date  . ANKLE SURGERY          Home Medications    Prior to Admission medications   Medication Sig Start Date End Date Taking? Authorizing Provider  atorvastatin (LIPITOR) 10 MG tablet Take 1 tablet by mouth daily.  02/19/18  Yes [provider]  lisinopril-hydrochlorothiazide (PRINZIDE,ZESTORETIC) 20-25 MG tablet Take 1 tablet by mouth daily.   Yes [provider]  metFORMIN (GLUCOPHAGE-XR) 500 MG 24 hr tablet Take 1,000 mg by mouth daily with breakfast.    Yes [provider]  metoprolol tartrate (LOPRESSOR) 50 MG tablet Take 50 mg  by mouth daily.    Yes [provider]  pantoprazole (PROTONIX) 40 MG tablet Take 1 tablet by mouth daily. 06/24/18  Yes [provider]    Family History No family history on file.  Social History Social History   Tobacco Use  . Smoking status: Never Smoker  . Smokeless tobacco: Current User    Types: Chew  Substance Use Topics  . Alcohol use: No  . Drug use: No     Allergies   Sulfamethoxazole-trimethoprim   Review of Systems Review of Systems  Constitutional: Negative for appetite change.  Respiratory: Negative for cough and shortness of breath.   Cardiovascular: Positive for chest pain.  Gastrointestinal: Negative for abdominal pain.  Genitourinary: Negative for flank pain.  Musculoskeletal: Negative for back pain.  Neurological: Negative for seizures.  Hematological: Negative for adenopathy.  Psychiatric/Behavioral: Negative for confusion.     Physical Exam Updated Vital Signs BP 132/84   Pulse (!) 115   Temp 99.1 F (37.3 C) (Oral)   Resp (!) 23   SpO2 96%   Physical Exam  Constitutional: He appears well-developed.  HENT:  Head: Atraumatic.  Eyes: Pupils are equal, round, and reactive to light.  Neck: Normal range of motion.  Cardiovascular: Regular rhythm.  Mild tachycardia  Pulmonary/Chest:  No chest tenderness.  Musculoskeletal:       Right lower leg: He exhibits no edema.       Left lower leg: He exhibits no edema.  Neurological: He is alert.  Skin: Skin is warm. Capillary refill takes less than 2 seconds.     ED Treatments / Results  Labs (all labs ordered are listed, but only abnormal results are displayed) Labs Reviewed  BASIC METABOLIC PANEL - Abnormal; Notable for the following components:      Result Value   Glucose, Bld 248 (*)    BUN 22 (*)    All other components within normal limits  CBC - Abnormal; Notable for the following components:   WBC 12.4 (*)    All other components within normal limits  CBG  MONITORING, ED - Abnormal; Notable for the following components:   Glucose-Capillary 268 (*)    All other components within normal limits  TROPONIN I  TSH  I-STAT TROPONIN, ED    EKG EKG Interpretation  Date/Time:  Saturday September 13 2018 15:13:52 EST Ventricular Rate:  118 PR Interval:    QRS Duration: 88 QT Interval:  309 QTC Calculation: 433 R Axis:   76 Text Interpretation:  Sinus tachycardia Borderline T abnormalities, diffuse leads unchanged from prior Confirmed by Benjiman Core [phone]) on 09/13/2018 3:25:25 PM   Radiology Dg Chest 2 View  Result Date: 09/13/2018 CLINICAL DATA:  Chest pain EXAM: CHEST - 2 VIEW COMPARISON:  12/10/2017 FINDINGS: Heart and mediastinal contours are within normal limits. No focal opacities or effusions. No acute bony abnormality. IMPRESSION: No active cardiopulmonary disease. Electronically Signed   By: Charlett Nose M.D.   On: 09/13/2018 16:10    Procedures Procedures (including critical care time)  Medications Ordered in ED Medications - No data to display   Initial Impression / Assessment and Plan / ED Course  I have reviewed the triage vital signs and the nursing notes.  Pertinent labs & imaging results that were available during my care of the patient were reviewed by me and considered in my medical decision making (see chart for details).     Patient with chest pain.  Low risk story but does have reportedly high cholesterol.  EKG stable.  Enzymes negative.  X-ray reassuring.  Heart rate is come down with rest.  Doubt cardiac cause.  Doubt pulmonary embolism.  Discharge home for outpatient follow-up  Final Clinical Impressions(s) / ED Diagnoses   Final diagnoses:  Nonspecific chest pain    ED Discharge Orders    None       Benjiman Core, MD 09/13/18 1740

## 2019-01-16 ENCOUNTER — Emergency Department (HOSPITAL_COMMUNITY): Payer: Self-pay

## 2019-01-16 ENCOUNTER — Other Ambulatory Visit: Payer: Self-pay

## 2019-01-16 ENCOUNTER — Emergency Department (HOSPITAL_COMMUNITY)
Admission: EM | Admit: 2019-01-16 | Discharge: 2019-01-16 | Disposition: A | Discharge status: Home / Self Care | Payer: Self-pay | Attending: Emergency Medicine | Admitting: Emergency Medicine

## 2019-01-16 ENCOUNTER — Encounter (HOSPITAL_COMMUNITY): Payer: Self-pay | Admitting: Emergency Medicine

## 2019-01-16 DIAGNOSIS — Z79899 Other long term (current) drug therapy: Secondary | ICD-10-CM | POA: Insufficient documentation

## 2019-01-16 DIAGNOSIS — I10 Essential (primary) hypertension: Secondary | ICD-10-CM | POA: Insufficient documentation

## 2019-01-16 DIAGNOSIS — M79671 Pain in right foot: Secondary | ICD-10-CM | POA: Insufficient documentation

## 2019-01-16 DIAGNOSIS — Z7984 Long term (current) use of oral hypoglycemic drugs: Secondary | ICD-10-CM | POA: Insufficient documentation

## 2019-01-16 DIAGNOSIS — E119 Type 2 diabetes mellitus without complications: Secondary | ICD-10-CM | POA: Insufficient documentation

## 2019-01-16 NOTE — ED Triage Notes (Signed)
Pt c/o sudden onset of RT ankle and foot pain that began yesterday. Denies injury or fall. Pt came in with cam walker. States he has it from an ankle injury to his LT ankle years ago.

## 2019-01-16 NOTE — Discharge Instructions (Signed)
Take over the counter tylenol, as directed on packaging, as needed for discomfort.  Wear well-supportive shoes, with soft cushions or insoles for the next several months.  Frequently perform the stretching exercises for your calf muscles.  Apply moist heat or ice to the area(s) of discomfort, for 15 minutes at a time, several times per day for the next few days.  Do not fall asleep on a heating or ice pack.  Call your regular medical doctor today to schedule a follow up appointment this week.  Call the Podiatrist today to schedule a follow up appointment within the next week. Return to the Emergency Department immediately if worsening.

## 2019-01-16 NOTE — ED Provider Notes (Signed)
[facility] EMERGENCY DEPARTMENT Provider Note   CSN: [id number] Arrival date & time: 01/16/19  0700    History   Chief Complaint Chief Complaint  Patient presents with  . Ankle Pain    HPI Jeffrey Collier is a 33 y.o. male.     HPI  Pt was seen at 0715. Per pt, c/o gradual onset and persistence of constant right medial plantar foot "pain" for the past 2 days. Pt states the pain radiates up into his right ankle. Pain worsens with stepping on his foot, improves with resting. States he had similar symptoms on his left foot arch several years ago, but states "they couldn't find out what it was." Denies injury, no rash, no fever, no back pain, no abd pain, no focal motor weakness, no tingling/numbness in extremities.   Past Medical History:  Diagnosis Date  . Diabetes mellitus without complication (HCC)   . High cholesterol   . Hypertension     There are no active problems to display for this patient.   Past Surgical History:  Procedure Laterality Date  . ANKLE SURGERY          Home Medications    Prior to Admission medications   Medication Sig Start Date End Date Taking? Authorizing Provider  atorvastatin (LIPITOR) 10 MG tablet Take 1 tablet by mouth daily.  02/19/18   [provider]  lisinopril-hydrochlorothiazide (PRINZIDE,ZESTORETIC) 20-25 MG tablet Take 1 tablet by mouth daily.    [provider]  metFORMIN (GLUCOPHAGE-XR) 500 MG 24 hr tablet Take 1,000 mg by mouth daily with breakfast.     [provider]  metoprolol tartrate (LOPRESSOR) 50 MG tablet Take 50 mg by mouth daily.     [provider]  pantoprazole (PROTONIX) 40 MG tablet Take 1 tablet by mouth daily. 06/24/18   [provider]    Family History No family history on file.  Social History Social History   Tobacco Use  . Smoking status: Never Smoker  . Smokeless tobacco: Current User    Types: Chew  Substance Use Topics  . Alcohol use: No  . Drug use:  No     Allergies   Sulfamethoxazole-trimethoprim   Review of Systems Review of Systems ROS: Statement: All systems negative except as marked or noted in the HPI; Constitutional: Negative for fever and chills. ; ; Eyes: Negative for eye pain, redness and discharge. ; ; ENMT: Negative for ear pain, hoarseness, nasal congestion, sinus pressure and sore throat. ; ; Cardiovascular: Negative for chest pain, palpitations, diaphoresis, dyspnea and peripheral edema. ; ; Respiratory: Negative for cough, wheezing and stridor. ; ; Gastrointestinal: Negative for nausea, vomiting, diarrhea, abdominal pain, blood in stool, hematemesis, jaundice and rectal bleeding. . ; ; Genitourinary: Negative for dysuria, flank pain and hematuria. ; ; Musculoskeletal: +right plantar foot pain. Negative for back pain and neck pain. Negative for swelling and trauma.; ; Skin: Negative for pruritus, rash, abrasions, blisters, bruising and skin lesion.; ; Neuro: Negative for headache, lightheadedness and neck stiffness. Negative for weakness, altered level of consciousness, altered mental status, extremity weakness, paresthesias, involuntary movement, seizure and syncope.       Physical Exam Updated Vital Signs BP 117/72 (BP Location: Left Arm)   Pulse 87   Temp 98.7 F (37.1 C) (Oral)   Resp 18   Ht 5\' 11"  (1.803 m)   Wt 117.9 kg   SpO2 98%   BMI 36.26 kg/m   Physical Exam 0720: Physical examination:  Nursing notes  reviewed; Vital signs and O2 SAT reviewed;  Constitutional: Well developed, Well nourished, Well hydrated, In no acute distress; Head:  Normocephalic, atraumatic; Eyes: EOMI, PERRL, No scleral icterus; ENMT: Mouth and pharynx normal, Mucous membranes moist; Neck: Supple, Full range of motion, No lymphadenopathy; Cardiovascular: Regular rate and rhythm, No gallop; Respiratory: Breath sounds clear & equal bilaterally, No wheezes.  Speaking full sentences with ease, Normal respiratory effort/excursion; Chest:  Nontender, Movement normal; Abdomen: Soft, Nontender, Nondistended, Normal bowel sounds; Genitourinary: No CVA tenderness; Extremities: Peripheral pulses normal, +right plantar foot medial arch tenderness to palp, no rash, no soft tissue crepitus. No deformity. No edema, No calf tenderness, edema or asymmetry. NT right knee/ankle/foot/toes. RLE muscles compartments soft, palp pedal pulses. Right foot warm/good color, well perfused..; Neuro: AA&Ox3, Major CN grossly intact.  Speech clear. No gross focal motor or sensory deficits in extremities.; Skin: Color normal, Warm, Dry.     ED Treatments / Results  Labs (all labs ordered are listed, but only abnormal results are displayed)   EKG None  Radiology   Procedures Procedures (including critical care time)  Medications Ordered in ED Medications - No data to display   Initial Impression / Assessment and Plan / ED Course  I have reviewed the triage vital signs and the nursing notes.  Pertinent labs & imaging results that were available during my care of the patient were reviewed by me and considered in my medical decision making (see chart for details).     MDM Reviewed: previous chart, nursing note and vitals Interpretation: x-ray   Dg Ankle Complete Right Result Date: 01/16/2019 CLINICAL DATA:  Sudden onset right ankle and foot pain beginning yesterday. No injury. EXAM: RIGHT ANKLE - COMPLETE 3+ VIEW COMPARISON:  None. FINDINGS: No evidence of fracture or dislocation. Ankle mortise is normal. Minimal degenerate change over the midfoot. IMPRESSION: No acute findings. Electronically Signed   By: Elberta Fortis M.D.   On: 01/16/2019 07:48   Dg Foot Complete Right Result Date: 01/16/2019 CLINICAL DATA:  Sudden onset right foot ankle pain beginning yesterday. No injury. EXAM: RIGHT FOOT COMPLETE - 3+ VIEW COMPARISON:  None. FINDINGS: There is no evidence of acute fracture or dislocation. Mild degenerate change over the talonavicular  joint. IMPRESSION: No acute findings. Electronically Signed   By: Elberta Fortis M.D.   On: 01/16/2019 07:49    0755:  XR reassuring. Likely plantar fasciitis; tx symptomatically at this time. Dx and testing d/w pt.  Questions answered.  Verb understanding, agreeable to d/c home with outpt f/u.   Final Clinical Impressions(s) / ED Diagnoses   Final diagnoses:  None    ED Discharge Orders    None       Samuel Jester, DO 01/18/19 1320

## 2019-01-16 NOTE — ED Notes (Signed)
Pt taken to xray 

## 2019-07-31 ENCOUNTER — Emergency Department (HOSPITAL_COMMUNITY)
Admission: EM | Admit: 2019-07-31 | Discharge: 2019-08-01 | Disposition: A | Discharge status: Home / Self Care | Payer: Self-pay | Attending: Emergency Medicine | Admitting: Emergency Medicine

## 2019-07-31 ENCOUNTER — Emergency Department (HOSPITAL_COMMUNITY): Payer: Self-pay

## 2019-07-31 ENCOUNTER — Other Ambulatory Visit: Payer: Self-pay

## 2019-07-31 ENCOUNTER — Encounter (HOSPITAL_COMMUNITY): Payer: Self-pay | Admitting: Emergency Medicine

## 2019-07-31 DIAGNOSIS — I10 Essential (primary) hypertension: Secondary | ICD-10-CM | POA: Insufficient documentation

## 2019-07-31 DIAGNOSIS — R1032 Left lower quadrant pain: Secondary | ICD-10-CM | POA: Insufficient documentation

## 2019-07-31 DIAGNOSIS — R079 Chest pain, unspecified: Secondary | ICD-10-CM | POA: Insufficient documentation

## 2019-07-31 DIAGNOSIS — F17220 Nicotine dependence, chewing tobacco, uncomplicated: Secondary | ICD-10-CM | POA: Insufficient documentation

## 2019-07-31 DIAGNOSIS — E119 Type 2 diabetes mellitus without complications: Secondary | ICD-10-CM | POA: Insufficient documentation

## 2019-07-31 LAB — CBC WITH DIFFERENTIAL/PLATELET
Abs Immature Granulocytes: 0.08 10*3/uL — ABNORMAL HIGH (ref 0.00–0.07)
Basophils Absolute: 0 10*3/uL (ref 0.0–0.1)
Basophils Relative: 0 %
Eosinophils Absolute: 0.1 10*3/uL (ref 0.0–0.5)
Eosinophils Relative: 1 %
HCT: 39.9 % (ref 39.0–52.0)
Hemoglobin: 13.3 g/dL (ref 13.0–17.0)
Immature Granulocytes: 1 %
Lymphocytes Relative: 34 %
Lymphs Abs: 3.6 10*3/uL (ref 0.7–4.0)
MCH: 30 pg (ref 26.0–34.0)
MCHC: 33.3 g/dL (ref 30.0–36.0)
MCV: 90.1 fL (ref 80.0–100.0)
Monocytes Absolute: 0.6 10*3/uL (ref 0.1–1.0)
Monocytes Relative: 6 %
Neutro Abs: 6.1 10*3/uL (ref 1.7–7.7)
Neutrophils Relative %: 58 %
Platelets: 318 10*3/uL (ref 150–400)
RBC: 4.43 MIL/uL (ref 4.22–5.81)
RDW: 13.4 % (ref 11.5–15.5)
WBC: 10.5 10*3/uL (ref 4.0–10.5)
nRBC: 0 % (ref 0.0–0.2)

## 2019-07-31 LAB — BASIC METABOLIC PANEL
Anion gap: 11 (ref 5–15)
BUN: 14 mg/dL (ref 6–20)
CO2: 21 mmol/L — ABNORMAL LOW (ref 22–32)
Calcium: 9 mg/dL (ref 8.9–10.3)
Chloride: 103 mmol/L (ref 98–111)
Creatinine, Ser: 0.97 mg/dL (ref 0.61–1.24)
GFR calc Af Amer: >60 mL/min (ref >60)
GFR calc non Af Amer: >60 mL/min (ref >60)
Glucose, Bld: 182 mg/dL — ABNORMAL HIGH (ref 70–99)
Potassium: 3.7 mmol/L (ref 3.5–5.1)
Sodium: 135 mmol/L (ref 135–145)

## 2019-07-31 LAB — TROPONIN I (HIGH SENSITIVITY)
Troponin I (High Sensitivity): <2 ng/L (ref <18)
Troponin I (High Sensitivity): <2 ng/L (ref <18)

## 2019-07-31 MED ORDER — HYDROMORPHONE HCL 1 MG/ML IJ SOLN
0.5000 mg | Freq: Once | INTRAMUSCULAR | Status: AC
  Administered 2019-07-31: 20:00:00 0.5 mg via INTRAVENOUS
  Filled 2019-07-31: qty 1

## 2019-07-31 MED ORDER — SODIUM CHLORIDE 0.9 % IV BOLUS
1000.0000 mL | Freq: Once | INTRAVENOUS | Status: AC
  Administered 2019-07-31: 1000 mL via INTRAVENOUS

## 2019-07-31 MED ORDER — IOHEXOL 350 MG/ML SOLN
100.0000 mL | Freq: Once | INTRAVENOUS | Status: AC | PRN
  Administered 2019-07-31: 23:00:00 100 mL via INTRAVENOUS

## 2019-07-31 NOTE — ED Notes (Signed)
Patient states he is on Metformin daily. Notified Bero, MD of pt concern. Will check pt CMP before taking to CT.

## 2019-07-31 NOTE — ED Triage Notes (Signed)
Pt with c/o chest pain x1.5 weeks. States pain is centralized but that it radiates to back at times.

## 2019-07-31 NOTE — Discharge Instructions (Addendum)
You were evaluated in the Emergency Department and after careful evaluation, we did not find any emergent condition requiring admission or further testing in the hospital.  Your exam/testing today is overall reassuring.  As discussed, please follow-up with cardiology and dermatology.  Please return to the Emergency Department if you experience any worsening of your condition.  We encourage you to follow up with a primary care provider.  Thank you for allowing [place] to be a part of your care.

## 2019-07-31 NOTE — ED Provider Notes (Signed)
[facility]-EMERGENCY DEPT [facility] Emergency Department Provider Note MRN:  [id number]  Arrival date & time: 07/31/19     Chief Complaint   Chest Pain   History of Present Illness   Jeffrey Collier is a 33 y.o. year-old male with a history of hypertension, diabetes presenting to the ED with chief complaint of chest pain.  Location: Central chest radiating to the central back as well as the left lower quadrant Duration: 1 to 2 weeks Onset: Sudden Timing: Intermittent Description: Sharp Severity: Severe Exacerbating/Alleviating Factors: None Associated Symptoms: Lightheadedness, shortness of breath Pertinent Negatives: Denies diaphoresis, no fever, no cough, no upper abdominal pain, no numbness or weakness to the arms or legs.   Review of Systems  A complete 10 system review of systems was obtained and all systems are negative except as noted in the HPI and PMH.   Patient's Health History    Past Medical History:  Diagnosis Date  . Diabetes mellitus without complication (HCC)   . High cholesterol   . Hypertension     Past Surgical History:  Procedure Laterality Date  . ANKLE SURGERY      No family history on file.  Social History   Socioeconomic History  . Marital status: Married    Spouse name: Not on file  . Number of children: Not on file  . Years of education: Not on file  . Highest education level: Not on file  Occupational History  . Not on file  Social Needs  . Financial resource strain: Not on file  . Food insecurity    Worry: Not on file    Inability: Not on file  . Transportation needs    Medical: Not on file    Non-medical: Not on file  Tobacco Use  . Smoking status: Never Smoker  . Smokeless tobacco: Current User    Types: Chew  Substance and Sexual Activity  . Alcohol use: No  . Drug use: No  . Sexual activity: Not on file  Lifestyle  . Physical activity    Days per week: Not on file    Minutes per session: Not on file  . Stress: Not on  file  Relationships  . Social Musician on phone: Not on file    Gets together: Not on file    Attends religious service: Not on file    Active member of club or organization: Not on file    Attends meetings of clubs or organizations: Not on file    Relationship status: Not on file  . Intimate partner violence    Fear of current or ex partner: Not on file    Emotionally abused: Not on file    Physically abused: Not on file    Forced sexual activity: Not on file  Other Topics Concern  . Not on file  Social History Narrative  . Not on file     Physical Exam  Vital Signs and Nursing Notes reviewed Vitals:   07/31/19 1928 07/31/19 2200  BP: 110/68 108/67  Pulse: 97 96  Resp: (!) 22 16  SpO2: 96% 97%    CONSTITUTIONAL: Well-appearing, NAD NEURO:  Alert and oriented x 3, no focal deficits EYES:  eyes equal and reactive ENT/NECK:  no LAD, no JVD CARDIO: Regular rate, well-perfused, normal S1 and S2 PULM:  CTAB no wheezing or rhonchi GI/GU:  normal bowel sounds, non-distended, tenderness to palpation to the left lower quadrant with guarding MSK/SPINE:  No gross deformities, no edema  SKIN:  no rash, atraumatic PSYCH:  Appropriate speech and behavior  Diagnostic and Interventional Summary    EKG Interpretation  Date/Time:  Friday July 31 2019 19:12:46 EDT Ventricular Rate:  108 PR Interval:    QRS Duration: 85 QT Interval:  314 QTC Calculation: 421 R Axis:   74 Text Interpretation:  Sinus tachycardia Borderline repolarization abnormality Confirmed by Gerlene Fee [phone]) on 07/31/2019 8:24:56 PM      Labs Reviewed  CBC WITH DIFFERENTIAL/PLATELET - Abnormal; Notable for the following components:      Result Value   Abs Immature Granulocytes 0.08 (*)    All other components within normal limits  BASIC METABOLIC PANEL - Abnormal; Notable for the following components:   CO2 21 (*)    Glucose, Bld 182 (*)    All other components within normal limits   TROPONIN I (HIGH SENSITIVITY)  TROPONIN I (HIGH SENSITIVITY)    CT Angio Chest/Abd/Pel for Dissection W and/or Wo Contrast  Final Result    DG Chest Port 1 View  Final Result      Medications  HYDROmorphone (DILAUDID) injection 0.5 mg (0.5 mg Intravenous Given 07/31/19 2022)  sodium chloride 0.9 % bolus 1,000 mL (0 mLs Intravenous Stopped 07/31/19 2207)  iohexol (OMNIPAQUE) 350 MG/ML injection 100 mL (100 mLs Intravenous Contrast Given 07/31/19 2247)     Procedures Critical Care  ED Course and Medical Decision Making  I have reviewed the triage vital signs and the nursing notes.  Pertinent labs & imaging results that were available during my care of the patient were reviewed by me and considered in my medical decision making (see below for details).  Will obtain CTA to exclude aortic dissection given the radiation to the back and abdomen.  Also considering diverticulitis, ACS.  EKG without ischemic changes.  Work-up is unrevealing, troponin negative x2, CTA completely unremarkable.  Patient is appropriate for discharge with cardiology follow-up.  Barth Kirks. Sedonia Small, [place] [email or web address]  Final Clinical Impressions(s) / ED Diagnoses     ICD-10-CM   1. Chest pain, unspecified type  R07.9   2. Chest pain  R07.9 DG Chest [facility] 1 View    DG Chest Port 1 View  3. Left lower quadrant abdominal pain  R10.32     ED Discharge Orders    None      Discharge Instructions Discussed with and Provided to Patient:   Discharge Instructions     You were evaluated in the Emergency Department and after careful evaluation, we did not find any emergent condition requiring admission or further testing in the hospital.  Your exam/testing today is overall reassuring.  As discussed, please follow-up with cardiology and dermatology.  Please return to the Emergency Department if you experience any worsening of your condition.  We  encourage you to follow up with a primary care provider.  Thank you for allowing [place] to be a part of your care.       Maudie Flakes, MD 07/31/19 [phone]

## 2020-04-17 ENCOUNTER — Emergency Department (HOSPITAL_COMMUNITY)
Admission: EM | Admit: 2020-04-17 | Discharge: 2020-04-17 | Disposition: A | Discharge status: Home / Self Care | Payer: Commercial Managed Care - PPO | Attending: Emergency Medicine | Admitting: Emergency Medicine

## 2020-04-17 ENCOUNTER — Encounter (HOSPITAL_COMMUNITY): Payer: Self-pay | Admitting: Emergency Medicine

## 2020-04-17 ENCOUNTER — Other Ambulatory Visit: Payer: Self-pay

## 2020-04-17 DIAGNOSIS — Y929 Unspecified place or not applicable: Secondary | ICD-10-CM | POA: Insufficient documentation

## 2020-04-17 DIAGNOSIS — S93402A Sprain of unspecified ligament of left ankle, initial encounter: Secondary | ICD-10-CM | POA: Insufficient documentation

## 2020-04-17 DIAGNOSIS — F17220 Nicotine dependence, chewing tobacco, uncomplicated: Secondary | ICD-10-CM | POA: Insufficient documentation

## 2020-04-17 DIAGNOSIS — X58XXXA Exposure to other specified factors, initial encounter: Secondary | ICD-10-CM | POA: Insufficient documentation

## 2020-04-17 DIAGNOSIS — Z79899 Other long term (current) drug therapy: Secondary | ICD-10-CM | POA: Insufficient documentation

## 2020-04-17 DIAGNOSIS — Z7984 Long term (current) use of oral hypoglycemic drugs: Secondary | ICD-10-CM | POA: Insufficient documentation

## 2020-04-17 DIAGNOSIS — I10 Essential (primary) hypertension: Secondary | ICD-10-CM | POA: Insufficient documentation

## 2020-04-17 DIAGNOSIS — E119 Type 2 diabetes mellitus without complications: Secondary | ICD-10-CM | POA: Insufficient documentation

## 2020-04-17 DIAGNOSIS — Z882 Allergy status to sulfonamides status: Secondary | ICD-10-CM | POA: Diagnosis not present

## 2020-04-17 DIAGNOSIS — Y939 Activity, unspecified: Secondary | ICD-10-CM | POA: Insufficient documentation

## 2020-04-17 DIAGNOSIS — S99912A Unspecified injury of left ankle, initial encounter: Secondary | ICD-10-CM | POA: Diagnosis present

## 2020-04-17 DIAGNOSIS — Y999 Unspecified external cause status: Secondary | ICD-10-CM | POA: Diagnosis not present

## 2020-04-17 MED ORDER — HYDROCODONE-ACETAMINOPHEN 5-325 MG PO TABS: ORAL_TABLET | ORAL | 0 refills | Status: AC

## 2020-04-17 NOTE — Discharge Instructions (Addendum)
Elevate and apply ice packs on and off to your ankle.  Continue taking your ibuprofen as directed.  Wear the ankle brace as needed for support.  Call one of the orthopedic providers listed to arrange follow-up appointment 1 week if not improving.

## 2020-04-17 NOTE — ED Triage Notes (Signed)
Pt c/o of left ankle pain since yesterday. Denies any new injury. Was seen at urgent care yesterday

## 2020-04-17 NOTE — ED Notes (Signed)
Call to Rad   Unable to find xray from [facility]  They will continue to look

## 2020-04-17 NOTE — ED Notes (Signed)
Pt w L ankle pain   Seen at urgent care yesterday   Xray  Told to RICE, OTC meds  Here today for pain

## 2020-04-18 NOTE — ED Provider Notes (Signed)
[facility] EMERGENCY DEPARTMENT Provider Note   CSN: [id number] Arrival date & time: 04/17/20  1012     History Chief Complaint  Patient presents with  . Ankle Pain    Dowell Hoon is a 34 y.o. male.  HPI      Vikrant Pryce is a 34 y.o. male with past medical history of of diabetes, hypertension, and previous surgical pinning of the left ankle.  He presents to the Emergency Department complaining of pain of his left ankle for 1 day.  He was seen at a local urgent care 1 day ago and had x-rays of the left ankle.  He states he was advised to elevate his ankle, apply ice, and take anti-inflammatory medication.  He presents to the emergency department today stating that the previous recommendations are not helping control his pain.  He describes having throbbing pain to the lateral left ankle that is worse with weightbearing.  He states that his ankle feels stiff with dorsi and plantar flexion.  He denies recent injury, swelling, redness, fever, chills, or pain proximal to the ankle. Past Medical History:  Diagnosis Date  . Diabetes mellitus without complication (HCC)   . High cholesterol   . Hypertension     There are no problems to display for this patient.   Past Surgical History:  Procedure Laterality Date  . ANKLE SURGERY         History reviewed. No pertinent family history.  Social History   Tobacco Use  . Smoking status: Never Smoker  . Smokeless tobacco: Current User    Types: Chew  Substance Use Topics  . Alcohol use: No  . Drug use: No    Home Medications Prior to Admission medications   Medication Sig Start Date End Date Taking? Authorizing Provider  atorvastatin (LIPITOR) 10 MG tablet Take 1 tablet by mouth daily.  02/19/18   [provider]  HYDROcodone-acetaminophen (NORCO/VICODIN) 5-325 MG tablet Take one tab po q 4 hrs prn pain 04/17/20   Lowella Kindley, PA-C  lisinopril-hydrochlorothiazide (PRINZIDE,ZESTORETIC) 20-25 MG tablet Take 1 tablet  by mouth daily.    [provider]  metFORMIN (GLUCOPHAGE-XR) 500 MG 24 hr tablet Take 1,000 mg by mouth daily with breakfast.     [provider]  metoprolol tartrate (LOPRESSOR) 50 MG tablet Take 50 mg by mouth daily.     [provider]  pantoprazole (PROTONIX) 40 MG tablet Take 1 tablet by mouth daily. 06/24/18   [provider]    Allergies    Sulfamethoxazole-trimethoprim  Review of Systems   Review of Systems  Constitutional: Negative for chills and fever.  Musculoskeletal: Positive for arthralgias (Left ankle pain). Negative for back pain and joint swelling.  Skin: Negative for color change, rash and wound.  Neurological: Negative for weakness and numbness.    Physical Exam Updated Vital Signs BP 131/87 (BP Location: Left Arm)   Pulse 86   Temp 97.7 F (36.5 C) (Oral)   Resp 18   Ht 5\' 11"  (1.803 m)   Wt 120.2 kg   SpO2 98%   BMI 36.96 kg/m   Physical Exam Vitals and nursing note reviewed.  Constitutional:      Appearance: Normal appearance. He is not ill-appearing or toxic-appearing.  Cardiovascular:     Rate and Rhythm: Normal rate and regular rhythm.     Pulses: Normal pulses.  Pulmonary:     Effort: Pulmonary effort is normal.  Musculoskeletal:        General:  Tenderness present. No deformity or signs of injury.     Right lower leg: No edema.     Left lower leg: No edema.     Comments: Tender to palpation of the lateral left ankle.  No palpable edema.  No excessive warmth or erythema.  Compartments of the extremity are soft.  No tenderness proximal to the ankle.  Well-healed surgical scar present at the lateral malleolus  Skin:    General: Skin is warm.     Capillary Refill: Capillary refill takes less than 2 seconds.  Neurological:     General: No focal deficit present.     Mental Status: He is alert.     Sensory: No sensory deficit.     Motor: No weakness.     ED Results / Procedures / Treatments   Labs (all  labs ordered are listed, but only abnormal results are displayed) Labs Reviewed - No data to display  EKG None  Radiology No results found.  Procedures Procedures (including critical care time)  Medications Ordered in ED Medications - No data to display  ED Course  I have reviewed the triage vital signs and the nursing notes.  Pertinent labs & imaging results that were available during my care of the patient were reviewed by me and considered in my medical decision making (see chart for details).    MDM Rules/Calculators/A&P                          Patient seen at local urgent care 1 day prior to this visit.  Here today for pain control.  History of previous surgical pinning of the left ankle.  No concerning symptoms for septic joint.  Likely musculoskeletal.  Neurovascularly intact.  Compartments are soft.  No reported injury recently.  I have reviewed patient's previous x-ray report that showed:  "Ankle mortise is normal.For screws over the distal tibia unchanged.  No acute fracture or dislocation. Mild degenerate change over the idfoot.  IMPRESSION: No acute findings.  "    ASO brace given, short course of pain medication prescribed after review of database, patient agrees to close orthopedic follow-up if needed.  I   Final Clinical Impression(s) / ED Diagnoses Final diagnoses:  Sprain of left ankle, unspecified ligament, initial encounter    Rx / DC Orders ED Discharge Orders         Ordered    HYDROcodone-acetaminophen (NORCO/VICODIN) 5-325 MG tablet     Discontinue  Reprint     04/17/20 1315           Kem Parkinson, PA-C 04/18/20 1310    Wyvonnia Dusky, MD 04/18/20 1751

## 2021-02-15 IMAGING — DX RIGHT FOOT COMPLETE - 3+ VIEW
3 series · 3 of 3 positions shown · non-contrast
Comparison: None.

CLINICAL DATA: Sudden onset right foot ankle pain beginning
yesterday. No injury.

EXAM:
RIGHT FOOT COMPLETE - 3+ VIEW

[foot ap]
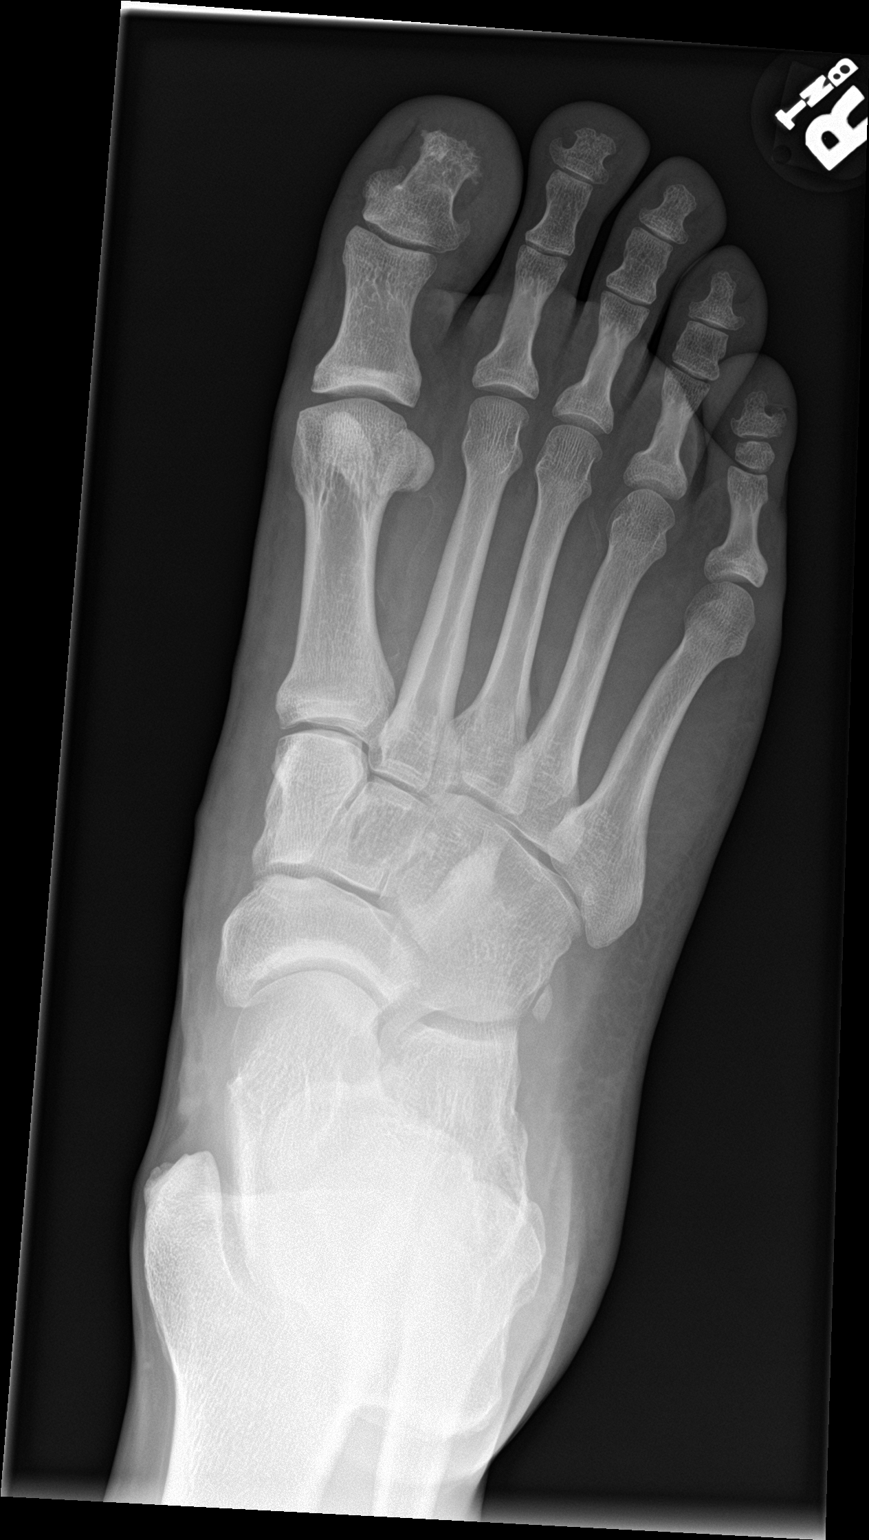

[foot obl]
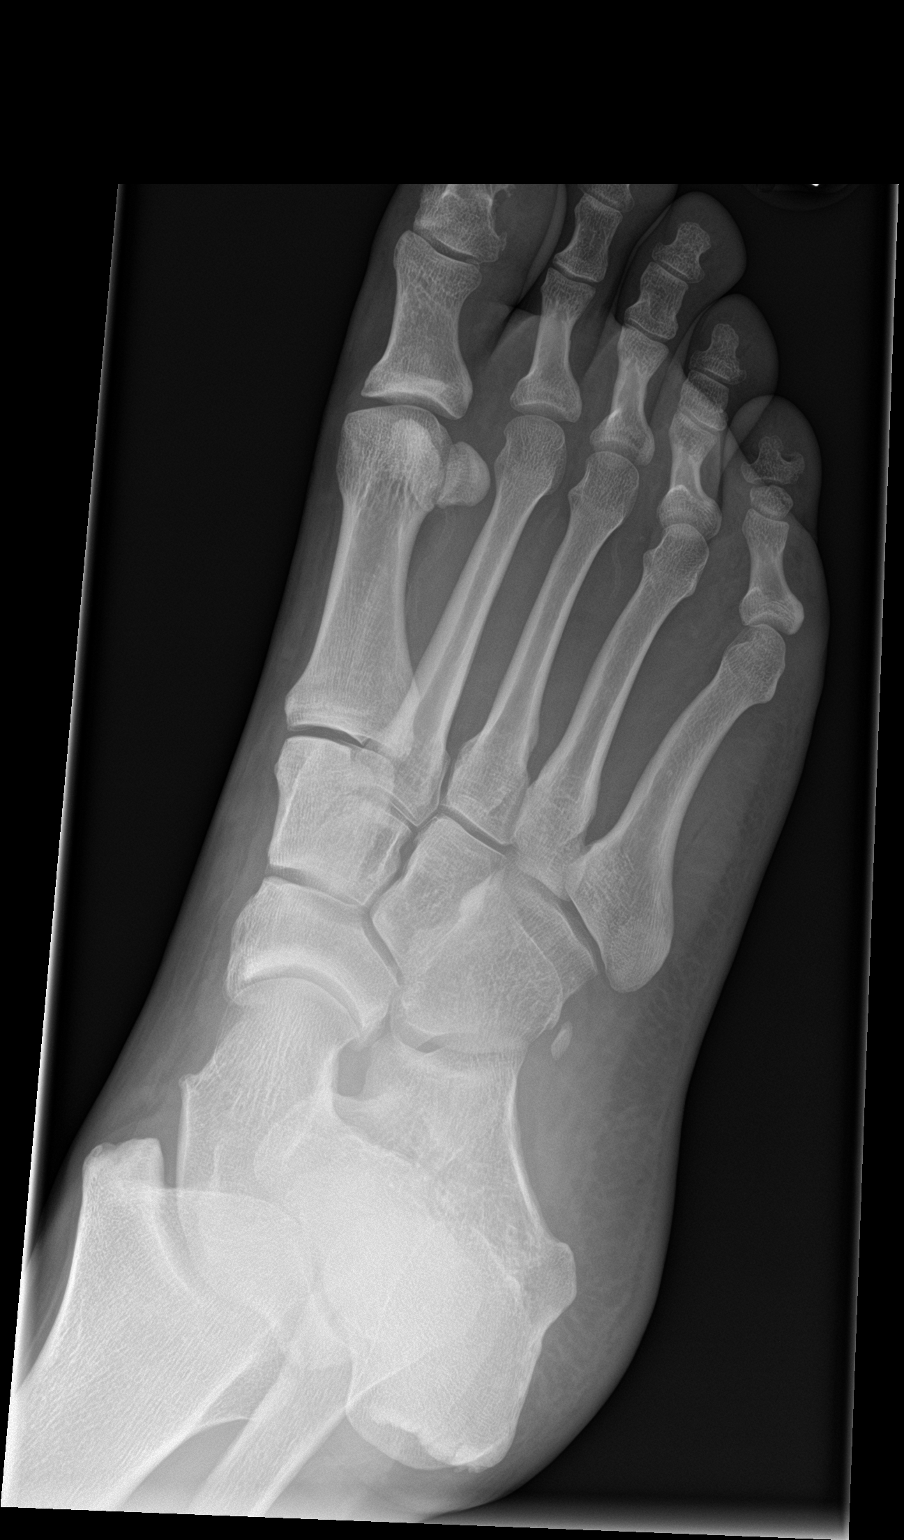

[foot lat]
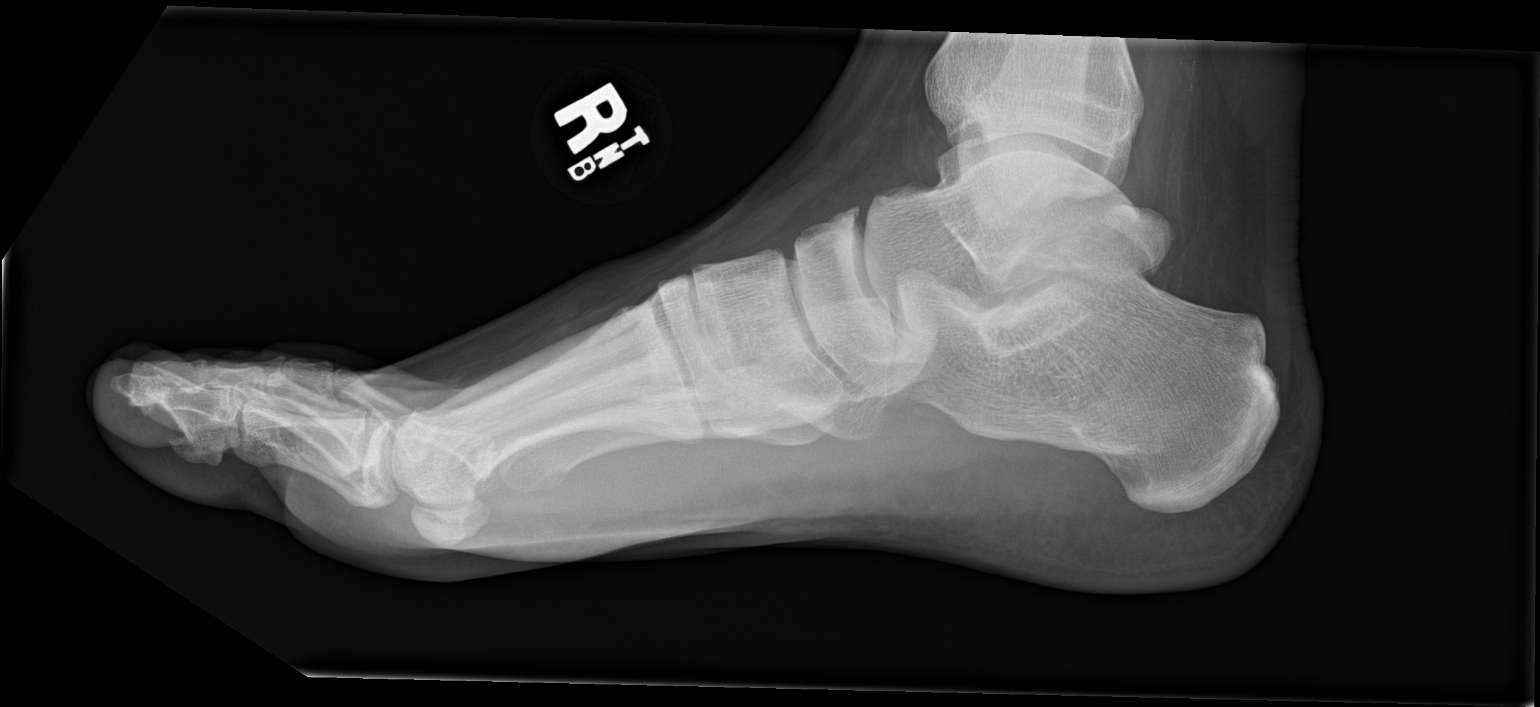

[3 of 3 positions shown; findings below may reference images not displayed]

FINDINGS: There is no evidence of acute fracture or dislocation. Mild
degenerate change over the talonavicular joint.
IMPRESSION: No acute findings.

## 2021-03-31 ENCOUNTER — Emergency Department (HOSPITAL_BASED_OUTPATIENT_CLINIC_OR_DEPARTMENT_OTHER)
Admission: EM | Admit: 2021-03-31 | Discharge: 2021-03-31 | Disposition: A | Discharge status: Home / Self Care | Payer: Commercial Managed Care - PPO | Attending: Emergency Medicine | Admitting: Emergency Medicine

## 2021-03-31 ENCOUNTER — Encounter (HOSPITAL_BASED_OUTPATIENT_CLINIC_OR_DEPARTMENT_OTHER): Payer: Self-pay | Admitting: Emergency Medicine

## 2021-03-31 ENCOUNTER — Other Ambulatory Visit: Payer: Self-pay

## 2021-03-31 ENCOUNTER — Emergency Department (HOSPITAL_BASED_OUTPATIENT_CLINIC_OR_DEPARTMENT_OTHER): Payer: Commercial Managed Care - PPO

## 2021-03-31 DIAGNOSIS — E7800 Pure hypercholesterolemia, unspecified: Secondary | ICD-10-CM | POA: Insufficient documentation

## 2021-03-31 DIAGNOSIS — E1169 Type 2 diabetes mellitus with other specified complication: Secondary | ICD-10-CM | POA: Insufficient documentation

## 2021-03-31 DIAGNOSIS — I10 Essential (primary) hypertension: Secondary | ICD-10-CM | POA: Insufficient documentation

## 2021-03-31 DIAGNOSIS — R9431 Abnormal electrocardiogram [ECG] [EKG]: Secondary | ICD-10-CM

## 2021-03-31 DIAGNOSIS — Z79899 Other long term (current) drug therapy: Secondary | ICD-10-CM | POA: Diagnosis not present

## 2021-03-31 DIAGNOSIS — R079 Chest pain, unspecified: Secondary | ICD-10-CM | POA: Insufficient documentation

## 2021-03-31 DIAGNOSIS — Z7984 Long term (current) use of oral hypoglycemic drugs: Secondary | ICD-10-CM | POA: Insufficient documentation

## 2021-03-31 LAB — CBC WITH DIFFERENTIAL/PLATELET
Abs Immature Granulocytes: 0.07 10*3/uL (ref 0.00–0.07)
Basophils Absolute: 0 10*3/uL (ref 0.0–0.1)
Basophils Relative: 0 %
Eosinophils Absolute: 0.1 10*3/uL (ref 0.0–0.5)
Eosinophils Relative: 1 %
HCT: 42.2 % (ref 39.0–52.0)
Hemoglobin: 15.3 g/dL (ref 13.0–17.0)
Immature Granulocytes: 1 %
Lymphocytes Relative: 32 %
Lymphs Abs: 3.6 10*3/uL (ref 0.7–4.0)
MCH: 31 pg (ref 26.0–34.0)
MCHC: 36.3 g/dL — ABNORMAL HIGH (ref 30.0–36.0)
MCV: 85.6 fL (ref 80.0–100.0)
Monocytes Absolute: 0.7 10*3/uL (ref 0.1–1.0)
Monocytes Relative: 6 %
Neutro Abs: 6.8 10*3/uL (ref 1.7–7.7)
Neutrophils Relative %: 60 %
Platelets: 318 10*3/uL (ref 150–400)
RBC: 4.93 MIL/uL (ref 4.22–5.81)
RDW: 13.1 % (ref 11.5–15.5)
WBC: 11.2 10*3/uL — ABNORMAL HIGH (ref 4.0–10.5)
nRBC: 0 % (ref 0.0–0.2)

## 2021-03-31 LAB — BASIC METABOLIC PANEL
Anion gap: 10 (ref 5–15)
BUN: 15 mg/dL (ref 6–20)
CO2: 23 mmol/L (ref 22–32)
Calcium: 9 mg/dL (ref 8.9–10.3)
Chloride: 102 mmol/L (ref 98–111)
Creatinine, Ser: 0.92 mg/dL (ref 0.61–1.24)
GFR, Estimated: >60 mL/min (ref >60)
Glucose, Bld: 279 mg/dL — ABNORMAL HIGH (ref 70–99)
Potassium: 3.6 mmol/L (ref 3.5–5.1)
Sodium: 135 mmol/L (ref 135–145)

## 2021-03-31 LAB — TROPONIN I (HIGH SENSITIVITY)
Troponin I (High Sensitivity): 6 ng/L (ref <18)
Troponin I (High Sensitivity): 4 ng/L (ref <18)

## 2021-03-31 MED ORDER — ALUM & MAG HYDROXIDE-SIMETH 200-200-20 MG/5ML PO SUSP
30.0000 mL | Freq: Once | ORAL | Status: AC
  Administered 2021-03-31: 30 mL via ORAL
  Filled 2021-03-31: qty 30

## 2021-03-31 MED ORDER — IBUPROFEN 800 MG PO TABS
800.0000 mg | ORAL_TABLET | Freq: Once | ORAL | Status: AC
  Administered 2021-03-31: 800 mg via ORAL
  Filled 2021-03-31: qty 1

## 2021-03-31 MED ORDER — ACETAMINOPHEN 500 MG PO TABS
1000.0000 mg | ORAL_TABLET | Freq: Once | ORAL | Status: AC
  Administered 2021-03-31: 1000 mg via ORAL
  Filled 2021-03-31: qty 2

## 2021-03-31 MED ORDER — OMEPRAZOLE 20 MG PO CPDR: 20.0000 mg | DELAYED_RELEASE_CAPSULE | Freq: Every day | ORAL | 0 refills | Status: AC

## 2021-03-31 NOTE — ED Provider Notes (Signed)
[facility] EMERGENCY DEPARTMENT Provider Note   CSN: [id number] Arrival date & time: 03/31/21  0217     History Chief Complaint  Patient presents with  . Chest Pain    Jeffrey Collier is a 35 y.o. male.  The history is provided by the patient.  Chest Pain Pain location:  L chest Pain quality: dull   Pain radiates to:  Does not radiate Pain severity:  Moderate Onset quality:  Gradual Duration:  3 days Timing:  Constant Progression:  Unchanged Chronicity:  New Context: at rest   Relieved by:  Nothing Worsened by:  Nothing Ineffective treatments:  None tried Associated symptoms: no abdominal pain, no AICD problem, no altered mental status, no anorexia, no back pain, no claudication, no cough, no diaphoresis, no dizziness, no nausea, no near-syncope, no numbness, no orthopnea, no palpitations, no PND, no shortness of breath, no syncope, no vomiting and no weakness   Risk factors: diabetes mellitus and male sex   Patient with diabetes mellitus presents with 3 days of chest pain.  No exertional symptoms, no n/v/d.       Past Medical History:  Diagnosis Date  . Diabetes mellitus without complication (HCC)   . High cholesterol   . Hypertension     There are no problems to display for this patient.   Past Surgical History:  Procedure Laterality Date  . ANKLE SURGERY         Family History  Problem Relation Age of Onset  . Heart attack Mother   . Prostate cancer Father     Social History   Tobacco Use  . Smoking status: Never Smoker  . Smokeless tobacco: Current User    Types: Chew  Vaping Use  . Vaping Use: Never used  Substance Use Topics  . Alcohol use: No  . Drug use: No    Home Medications Prior to Admission medications   Medication Sig Start Date End Date Taking? Authorizing Provider  atorvastatin (LIPITOR) 10 MG tablet Take 1 tablet by mouth daily.  02/19/18   [provider]  HYDROcodone-acetaminophen (NORCO/VICODIN) 5-325 MG  tablet Take one tab po q 4 hrs prn pain 04/17/20   Triplett, Tammy, PA-C  lisinopril-hydrochlorothiazide (PRINZIDE,ZESTORETIC) 20-25 MG tablet Take 1 tablet by mouth daily.    [provider]  metFORMIN (GLUCOPHAGE-XR) 500 MG 24 hr tablet Take 1,000 mg by mouth daily with breakfast.     [provider]  metoprolol tartrate (LOPRESSOR) 50 MG tablet Take 50 mg by mouth daily.     [provider]  pantoprazole (PROTONIX) 40 MG tablet Take 1 tablet by mouth daily. 06/24/18   [provider]    Allergies    Sulfamethoxazole-trimethoprim  Review of Systems   Review of Systems  Constitutional: Negative for diaphoresis.  HENT: Negative for congestion.   Eyes: Negative for visual disturbance.  Respiratory: Negative for cough and shortness of breath.   Cardiovascular: Positive for chest pain. Negative for palpitations, orthopnea, claudication, syncope, PND and near-syncope.  Gastrointestinal: Negative for abdominal pain, anorexia, nausea and vomiting.  Genitourinary: Negative for difficulty urinating.  Musculoskeletal: Negative for back pain.  Skin: Negative for rash.  Neurological: Negative for dizziness, weakness and numbness.  Psychiatric/Behavioral: Negative for agitation.  All other systems reviewed and are negative.   Physical Exam Updated Vital Signs BP (!) 138/92 (BP Location: Left Arm)   Pulse (!) 106   Temp 98.9 F (37.2 C) (Oral)   Resp 18   Ht 5\' 11"  (  1.803 m)   Wt 120.2 kg   SpO2 97%   BMI 36.96 kg/m   Physical Exam Vitals and nursing note reviewed.  Constitutional:      Appearance: Normal appearance. He is not diaphoretic.  HENT:     Head: Normocephalic and atraumatic.     Nose: Nose normal.  Eyes:     Conjunctiva/sclera: Conjunctivae normal.     Pupils: Pupils are equal, round, and reactive to light.  Cardiovascular:     Rate and Rhythm: Normal rate and regular rhythm.     Pulses: Normal pulses.     Heart sounds: Normal heart  sounds.  Pulmonary:     Effort: Pulmonary effort is normal.     Breath sounds: Normal breath sounds.  Abdominal:     General: Abdomen is flat. Bowel sounds are normal.     Palpations: Abdomen is soft.     Tenderness: There is no abdominal tenderness. There is no guarding.  Musculoskeletal:        General: Normal range of motion.     Cervical back: Normal range of motion and neck supple.  Skin:    General: Skin is warm and dry.     Capillary Refill: Capillary refill takes less than 2 seconds.  Neurological:     General: No focal deficit present.     Mental Status: He is alert and oriented to person, place, and time.     Deep Tendon Reflexes: Reflexes normal.  Psychiatric:        Mood and Affect: Mood normal.        Behavior: Behavior normal.     ED Results / Procedures / Treatments   Labs (all labs ordered are listed, but only abnormal results are displayed) Results for orders placed or performed during the hospital encounter of 03/31/21  CBC with Differential/Platelet  Result Value Ref Range   WBC 11.2 (H) 4.0 - 10.5 K/uL   RBC 4.93 4.22 - 5.81 MIL/uL   Hemoglobin 15.3 13.0 - 17.0 g/dL   HCT 16.1 09.6 - 04.5 %   MCV 85.6 80.0 - 100.0 fL   MCH 31.0 26.0 - 34.0 pg   MCHC 36.3 (H) 30.0 - 36.0 g/dL   RDW 40.9 81.1 - 91.4 %   Platelets 318 150 - 400 K/uL   nRBC 0.0 0.0 - 0.2 %   Neutrophils Relative % 60 %   Neutro Abs 6.8 1.7 - 7.7 K/uL   Lymphocytes Relative 32 %   Lymphs Abs 3.6 0.7 - 4.0 K/uL   Monocytes Relative 6 %   Monocytes Absolute 0.7 0.1 - 1.0 K/uL   Eosinophils Relative 1 %   Eosinophils Absolute 0.1 0.0 - 0.5 K/uL   Basophils Relative 0 %   Basophils Absolute 0.0 0.0 - 0.1 K/uL   Immature Granulocytes 1 %   Abs Immature Granulocytes 0.07 0.00 - 0.07 K/uL  Basic metabolic panel  Result Value Ref Range   Sodium 135 135 - 145 mmol/L   Potassium 3.6 3.5 - 5.1 mmol/L   Chloride 102 98 - 111 mmol/L   CO2 23 22 - 32 mmol/L   Glucose, Bld 279 (H) 70 - 99  mg/dL   BUN 15 6 - 20 mg/dL   Creatinine, Ser 7.82 0.61 - 1.24 mg/dL   Calcium 9.0 8.9 - 95.6 mg/dL   GFR, Estimated >21 >30 mL/min   Anion gap 10 5 - 15   DG Chest Portable 1 View  Result Date: 03/31/2021 CLINICAL DATA:  Intermittent chest pain for 3 days, left-sided pain EXAM: PORTABLE CHEST 1 VIEW COMPARISON:  07/31/2019 FINDINGS: The heart size and mediastinal contours are within normal limits. Both lungs are clear. The visualized skeletal structures are unremarkable. IMPRESSION: No active disease. Electronically Signed   By: Sharlet Salina M.D.   On: 03/31/2021 02:55    EKG EKG Interpretation  Date/Time:  Friday March 31 2021 02:33:25 EDT Ventricular Rate:  106 PR Interval:  128 QRS Duration: 80 QT Interval:  322 QTC Calculation: 427 R Axis:   42 Text Interpretation: Sinus tachycardia  ST T changes, unchanged Confirmed by Savita Runner ([id number]) on 03/31/2021 2:39:33 AM   Radiology DG Chest Portable 1 View  Result Date: 03/31/2021 CLINICAL DATA:  Intermittent chest pain for 3 days, left-sided pain EXAM: PORTABLE CHEST 1 VIEW COMPARISON:  07/31/2019 FINDINGS: The heart size and mediastinal contours are within normal limits. Both lungs are clear. The visualized skeletal structures are unremarkable. IMPRESSION: No active disease. Electronically Signed   By: Sharlet Salina M.D.   On: 03/31/2021 02:55    Procedures Procedures   Medications Ordered in ED Medications  ibuprofen (ADVIL) tablet 800 mg (800 mg Oral Given 03/31/21 0328)  acetaminophen (TYLENOL) tablet 1,000 mg (1,000 mg Oral Given 03/31/21 0328)  alum & mag hydroxide-simeth (MAALOX/MYLANTA) 200-200-20 MG/5ML suspension 30 mL (30 mLs Oral Given 03/31/21 0327)    ED Course  I have reviewed the triage vital signs and the nursing notes.  Pertinent labs & imaging results that were available during my care of the patient were reviewed by me and considered in my medical decision making (see chart for details).   I have seen and  appreciate nurses note but the patient does not endorse HA or nose bleed for me.  Regardless, the nasal mucosa is normal.  Patient is very well and comfortable appearing.  Pain is atypical and I suspect this is GERD.  Will prescribe gerd friendly diet and PPI.    I have informed the patient of the abnormal EKG and need for close follow up with his cardiologist. This EKG is abnormal but unchanged from previous.  I have advised immediately outpatient follow up to review the EKG and for further diagnostic work up.  Patient verbalizes understanding and agrees to follow up.  Strict return precautions given.     Colan Laymon was evaluated in Emergency Department on 03/31/2021 for the symptoms described in the history of present illness. He was evaluated in the context of the global COVID-19 pandemic, which necessitated consideration that the patient might be at risk for infection with the SARS-CoV-2 virus that causes COVID-19. Institutional protocols and algorithms that pertain to the evaluation of patients at risk for COVID-19 are in a state of rapid change based on information released by regulatory bodies including the CDC and federal and state organizations. These policies and algorithms were followed during the patient's care in the ED.  Final Clinical Impression(s) / ED Diagnoses Final diagnoses:  Abnormal EKG   Return for intractable cough, coughing up blood, fevers >100.4 unrelieved by medication, shortness of breath, intractable vomiting, chest pain, shortness of breath, weakness, numbness, changes in speech, facial asymmetry, abdominal pain, passing out, Inability to tolerate liquids or food, cough, altered mental status or any concerns. No signs of systemic illness or infection. The patient is nontoxic-appearing on exam and vital signs are within normal limits.  I have reviewed the triage vital signs and the nursing notes. Pertinent labs & imaging results that were available  during my care of the  patient were reviewed by me and considered in my medical decision making (see chart for details). After history, exam, and medical workup I feel the patient has been appropriately medically screened and is safe for discharge home. Pertinent diagnoses were discussed with the patient. Patient was given return precautions.    Eldo Umanzor, MD 03/31/21 [id number]

## 2021-03-31 NOTE — ED Triage Notes (Signed)
Pt is c/o intermittent chest pain x 3 days   Pt states the pain is on the left side and feels like a dull ache  Pt states he had a heart cath in Feb, and tonight on his EKG he had inverted t waves that EMS did  Pt states he has also been having migraine headaches for the past 4 days and intermittent nosebleeds for over a week

## 2021-11-05 ENCOUNTER — Emergency Department (HOSPITAL_BASED_OUTPATIENT_CLINIC_OR_DEPARTMENT_OTHER): Payer: Self-pay

## 2021-11-05 ENCOUNTER — Emergency Department (HOSPITAL_BASED_OUTPATIENT_CLINIC_OR_DEPARTMENT_OTHER)
Admission: EM | Admit: 2021-11-05 | Discharge: 2021-11-05 | Disposition: A | Discharge status: Home / Self Care | Payer: Self-pay | Attending: Emergency Medicine | Admitting: Emergency Medicine

## 2021-11-05 ENCOUNTER — Encounter (HOSPITAL_BASED_OUTPATIENT_CLINIC_OR_DEPARTMENT_OTHER): Payer: Self-pay | Admitting: Emergency Medicine

## 2021-11-05 ENCOUNTER — Other Ambulatory Visit: Payer: Self-pay

## 2021-11-05 DIAGNOSIS — R519 Headache, unspecified: Secondary | ICD-10-CM | POA: Insufficient documentation

## 2021-11-05 DIAGNOSIS — F17220 Nicotine dependence, chewing tobacco, uncomplicated: Secondary | ICD-10-CM | POA: Insufficient documentation

## 2021-11-05 DIAGNOSIS — M542 Cervicalgia: Secondary | ICD-10-CM | POA: Insufficient documentation

## 2021-11-05 LAB — CBC WITH DIFFERENTIAL/PLATELET
Abs Immature Granulocytes: 0.05 10*3/uL (ref 0.00–0.07)
Basophils Absolute: 0 10*3/uL (ref 0.0–0.1)
Basophils Relative: 0 %
Eosinophils Absolute: 0.1 10*3/uL (ref 0.0–0.5)
Eosinophils Relative: 1 %
HCT: 46.8 % (ref 39.0–52.0)
Hemoglobin: 16.5 g/dL (ref 13.0–17.0)
Immature Granulocytes: 1 %
Lymphocytes Relative: 29 %
Lymphs Abs: 2.7 10*3/uL (ref 0.7–4.0)
MCH: 30.2 pg (ref 26.0–34.0)
MCHC: 35.3 g/dL (ref 30.0–36.0)
MCV: 85.7 fL (ref 80.0–100.0)
Monocytes Absolute: 0.6 10*3/uL (ref 0.1–1.0)
Monocytes Relative: 6 %
Neutro Abs: 5.8 10*3/uL (ref 1.7–7.7)
Neutrophils Relative %: 63 %
Platelets: 320 10*3/uL (ref 150–400)
RBC: 5.46 MIL/uL (ref 4.22–5.81)
RDW: 13 % (ref 11.5–15.5)
WBC: 9.2 10*3/uL (ref 4.0–10.5)
nRBC: 0 % (ref 0.0–0.2)

## 2021-11-05 LAB — BASIC METABOLIC PANEL
Anion gap: 13 (ref 5–15)
BUN: 11 mg/dL (ref 6–20)
CO2: 23 mmol/L (ref 22–32)
Calcium: 9.4 mg/dL (ref 8.9–10.3)
Chloride: 97 mmol/L — ABNORMAL LOW (ref 98–111)
Creatinine, Ser: 0.99 mg/dL (ref 0.61–1.24)
GFR, Estimated: >60 mL/min (ref >60)
Glucose, Bld: 271 mg/dL — ABNORMAL HIGH (ref 70–99)
Potassium: 4 mmol/L (ref 3.5–5.1)
Sodium: 133 mmol/L — ABNORMAL LOW (ref 135–145)

## 2021-11-05 MED ORDER — IOHEXOL 300 MG/ML  SOLN
100.0000 mL | Freq: Once | INTRAMUSCULAR | Status: AC | PRN
  Administered 2021-11-05: 75 mL via INTRAVENOUS

## 2021-11-05 NOTE — ED Provider Notes (Signed)
[facility] EMERGENCY DEPT Provider Note   CSN: [id number] Arrival date & time: 11/05/21  1005     History  Chief Complaint  Patient presents with   Headache    Jeffrey Collier is a 36 y.o. male.  Patient is a 36 yo male PMH of tobacco use presents to ED for neck pain. Pt admits to "bulge on left side of neck" with pain radiating to face, intermittent, associated with headache, and sometimes difficulty swallowing x several months. Pt has hx of chewing tobacco use and expresses concern for throat/mouth cancer. Denies previous URI or viral illness. Denies previous injuries or skin wounds.   The history is provided by the patient. No language interpreter was used.  Headache Associated symptoms: no abdominal pain, no back pain, no cough, no ear pain, no eye pain, no fever, no seizures, no sore throat and no vomiting       Home Medications Prior to Admission medications   Medication Sig Start Date End Date Taking? Authorizing Provider  atorvastatin (LIPITOR) 10 MG tablet Take 1 tablet by mouth daily.  02/19/18   [provider]  HYDROcodone-acetaminophen (NORCO/VICODIN) 5-325 MG tablet Take one tab po q 4 hrs prn pain 04/17/20   Triplett, Tammy, PA-C  lisinopril-hydrochlorothiazide (PRINZIDE,ZESTORETIC) 20-25 MG tablet Take 1 tablet by mouth daily.    [provider]  metFORMIN (GLUCOPHAGE-XR) 500 MG 24 hr tablet Take 1,000 mg by mouth daily with breakfast.     [provider]  metoprolol tartrate (LOPRESSOR) 50 MG tablet Take 50 mg by mouth daily.     [provider]  omeprazole (PRILOSEC) 20 MG capsule Take 1 capsule (20 mg total) by mouth daily. 03/31/21   Palumbo, April, MD  pantoprazole (PROTONIX) 40 MG tablet Take 1 tablet by mouth daily. 06/24/18   [provider]      Allergies    Sulfamethoxazole-trimethoprim    Review of Systems   Review of Systems  Constitutional:  Negative for chills and fever.  HENT:  Negative for ear  pain and sore throat.        Neck pain   Eyes:  Negative for pain and visual disturbance.  Respiratory:  Negative for cough and shortness of breath.   Cardiovascular:  Negative for chest pain and palpitations.  Gastrointestinal:  Negative for abdominal pain and vomiting.  Genitourinary:  Negative for dysuria and hematuria.  Musculoskeletal:  Negative for arthralgias and back pain.  Skin:  Negative for color change and rash.  Neurological:  Positive for headaches. Negative for seizures and syncope.  All other systems reviewed and are negative.  Physical Exam Updated Vital Signs BP 109/71    Pulse 86    Temp 98.3 F (36.8 C) (Oral)    Resp 19    Ht 5\' 11"  (1.803 m)    Wt 122.5 kg    SpO2 95%    BMI 37.66 kg/m  Physical Exam Vitals and nursing note reviewed.  Constitutional:      General: He is not in acute distress.    Appearance: He is well-developed.  HENT:     Head: Normocephalic and atraumatic.      Comments: No masses palpated. Tenderness to palpation of left anterior cervical chain lymph nodes. No lymphadenopathy. No trismus, difficulty swallowing, mouth swelling, or drooling.  Eyes:     Conjunctiva/sclera: Conjunctivae normal.  Cardiovascular:     Rate and Rhythm: Normal rate and regular rhythm.     Heart sounds: No murmur heard. Pulmonary:  Effort: Pulmonary effort is normal. No respiratory distress.     Breath sounds: Normal breath sounds.  Abdominal:     Palpations: Abdomen is soft.     Tenderness: There is no abdominal tenderness.  Musculoskeletal:        General: No swelling.     Cervical back: Neck supple.  Skin:    General: Skin is warm and dry.     Capillary Refill: Capillary refill takes less than 2 seconds.  Neurological:     Mental Status: He is alert and oriented to person, place, and time.     GCS: GCS eye subscore is 4. GCS verbal subscore is 5. GCS motor subscore is 6.  Psychiatric:        Mood and Affect: Mood normal.    ED Results /  Procedures / Treatments   Labs (all labs ordered are listed, but only abnormal results are displayed) Labs Reviewed - No data to display  EKG None  Radiology No results found.  Procedures Procedures    Medications Ordered in ED Medications - No data to display  ED Course/ Medical Decision Making/ A&P                           Medical Decision Making  36 yo male presenting for neck pain and concerns for neck mass/malignancy due to chewing tobacco hx. On exam patient is afebrile with stable vitals. No masses palpated. Tenderness to palpation of left anterior cervical chain lymph nodes. No lymphadenopathy. No trismus, difficulty swallowing, mouth swelling, or drooling. CT neck demonstrates no acute process. Patient recommended for follow up with pcp if pain continues.   Doubt acute illness due to chronicity of pain.  I spent at least 3 minutes discussing the dangers of chewing tobacco and encouraged patient to quit to prevent throat/mouth cancer.   Patient in no distress and overall condition improved here in the ED. Detailed discussions were had with the patient regarding current findings, and need for close f/u with PCP or on call doctor. The patient has been instructed to return immediately if the symptoms worsen in any way for re-evaluation. Patient verbalized understanding and is in agreement with current care plan. All questions answered prior to discharge.            Final Clinical Impression(s) / ED Diagnoses Final diagnoses:  Neck pain    Rx / DC Orders ED Discharge Orders     None         Lianne Cure, DO [id number]

## 2021-11-05 NOTE — ED Triage Notes (Signed)
Pt reports, onset Friday, having a knot in his neck that is causing pain into left eye and left side of head. Patient also reports feeling intermittent palpitations onset Friday.

## 2022-10-23 ENCOUNTER — Ambulatory Visit: Admit: 2022-10-23 | Discharge status: Absent without leave | Payer: Self-pay

## 2023-12-06 IMAGING — CT CT NECK W/ CM
4 of 5 series · 13 of 33 positions shown, 15 images · IV contrast (APPLIED)
Comparison: None.

CLINICAL DATA: Neck mass, nonpulsatile mass in SCM left, unable to
palpate, chewing tobacco user

EXAM:
CT NECK WITH CONTRAST
TECHNIQUE: Multidetector CT imaging of the neck was performed using the
standard protocol following the bolus administration of intravenous
contrast.
CONTRAST:  75mL OMNIPAQUE IOHEXOL 300 MG/ML  SOLN

[Series 2: axial neck (person_name) · axial · 0.62mm/px · z∈[-270,-204]mm · 2 of 132 slices shown]
[im 33/132  bone]
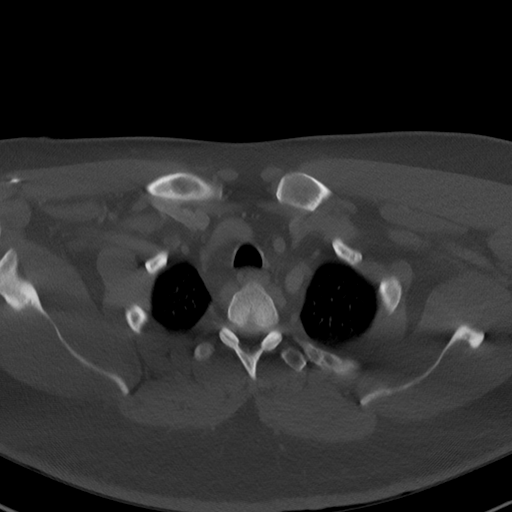
[im 66/132  bone]
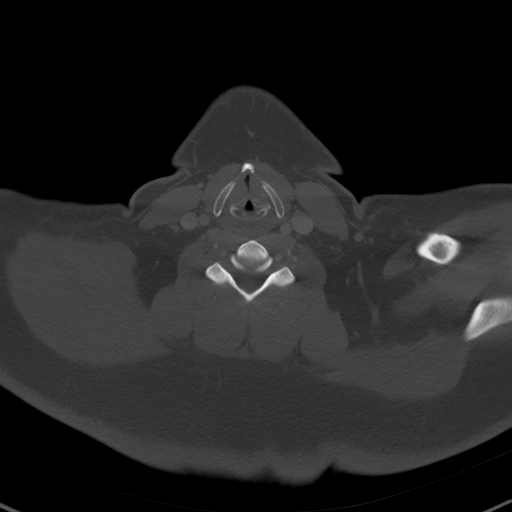

[Series 4: cor neck · coronal · 0.60mm/px · 3 of 117 slices shown]
[im 33/117  bone]
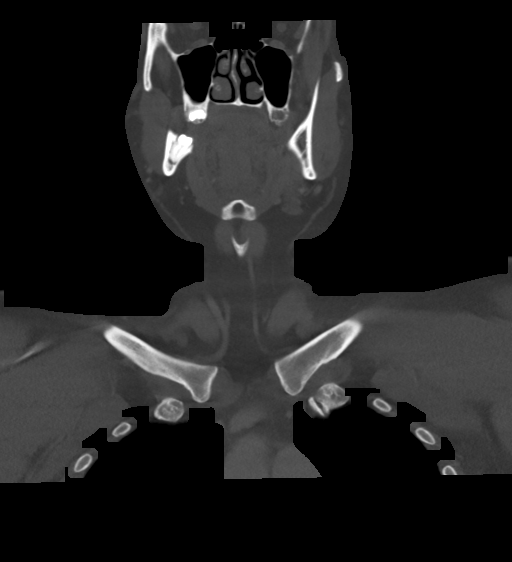
[im 50/117  bone]
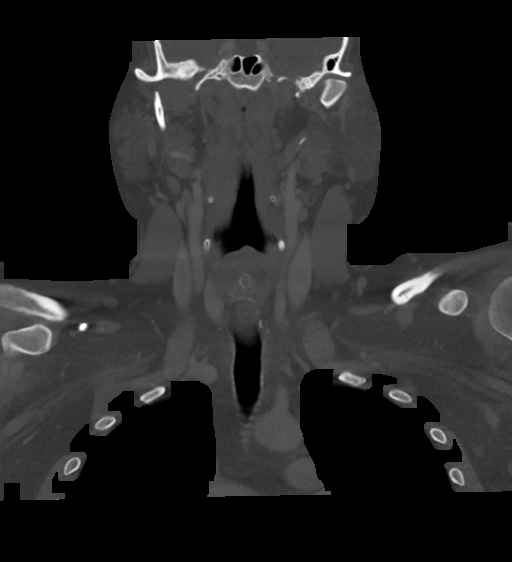
[im 67/117  bone]
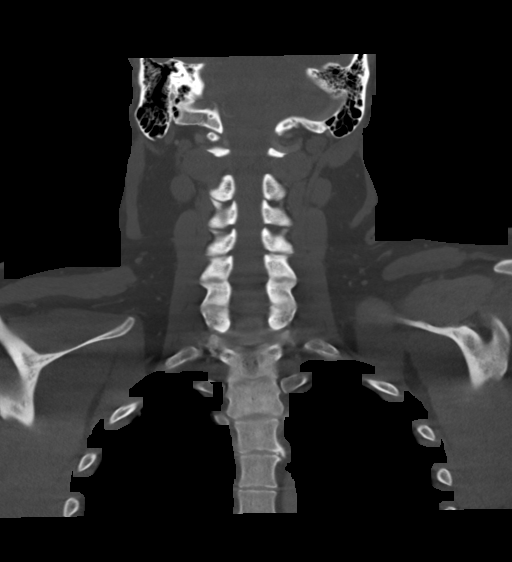

[Series 5: sag neck · sagittal · 0.45mm/px · 5 of 134 slices shown, 6 images]
[im 45/134  bone]
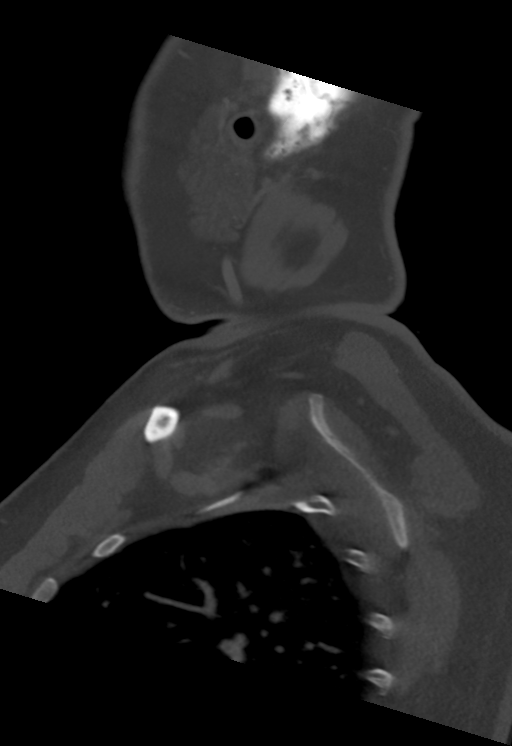
[im 56/134  bone]
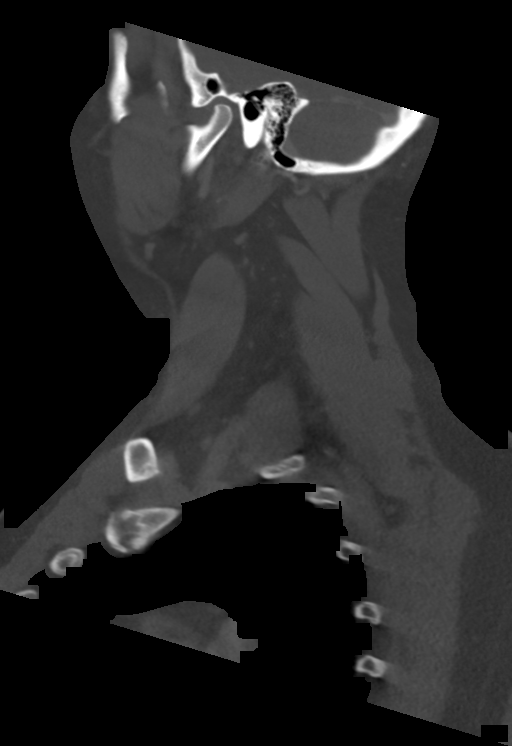
[im 67/134  soft-tissue]
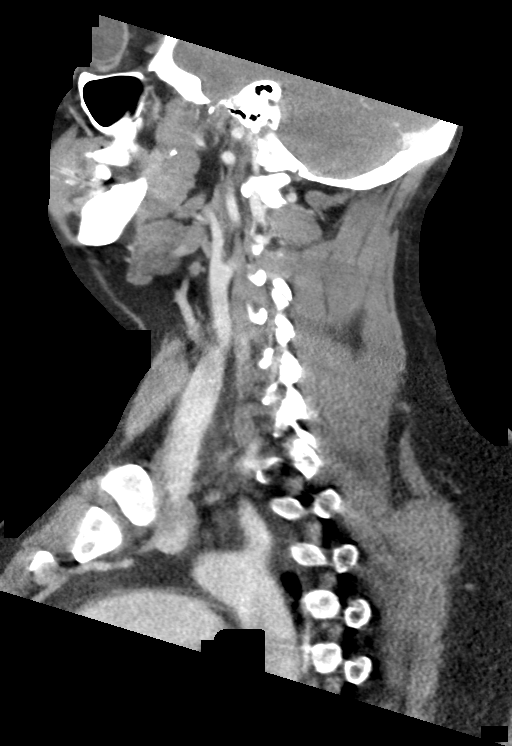
[im 67/134  bone]
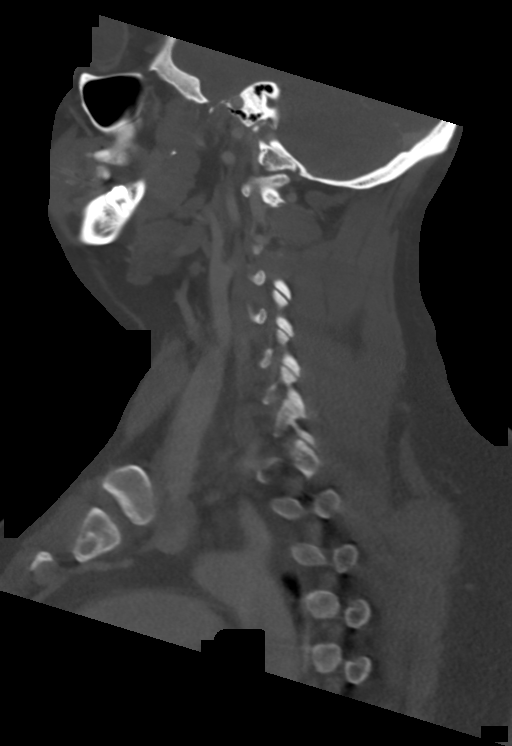
[im 78/134  bone]
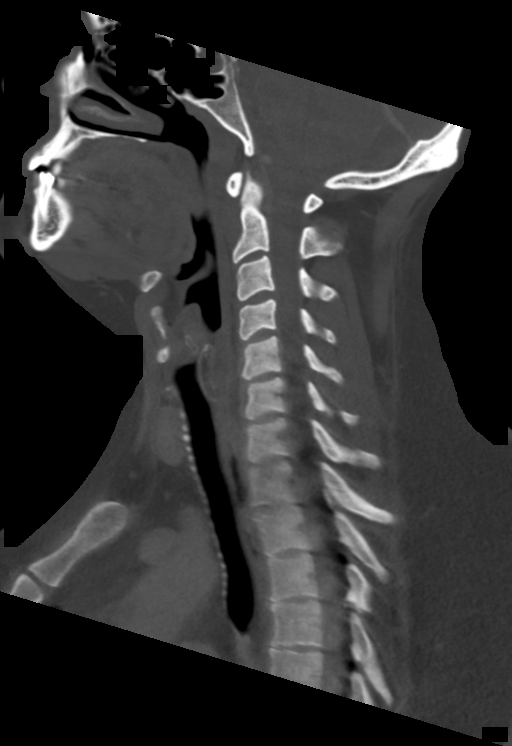
[im 89/134  bone]
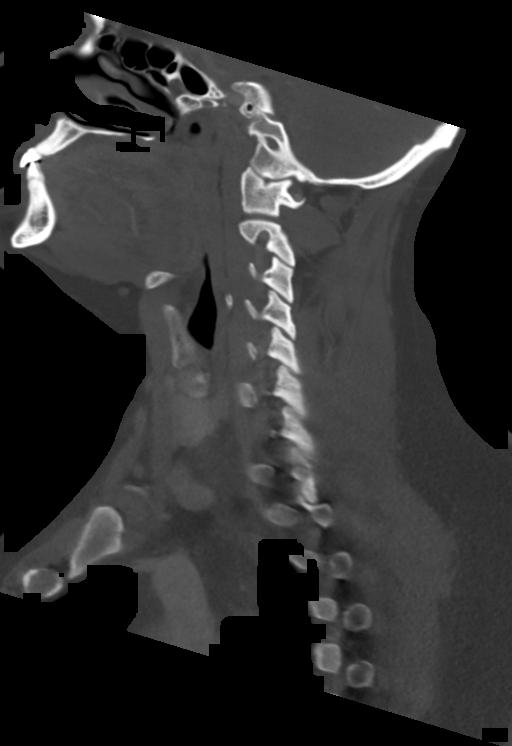

[Series 6: ax oropharynx · axial · 0.45mm/px · z∈[-318,-158]mm · 3 of 170 slices shown, 4 images]
[im 43/170  soft-tissue]
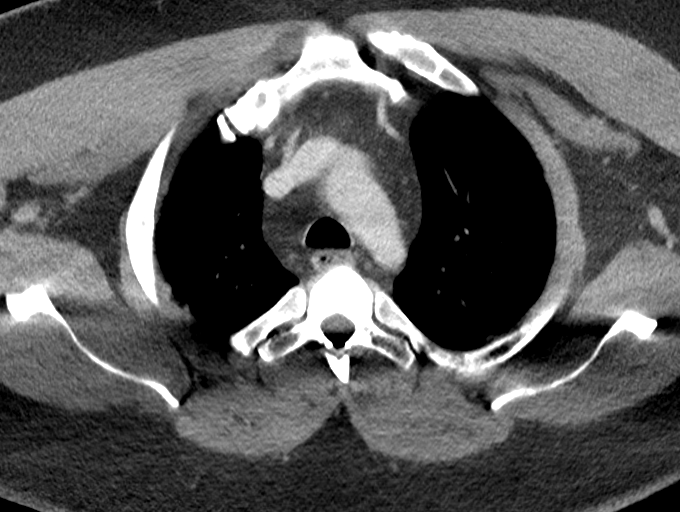
[im 43/170  bone]
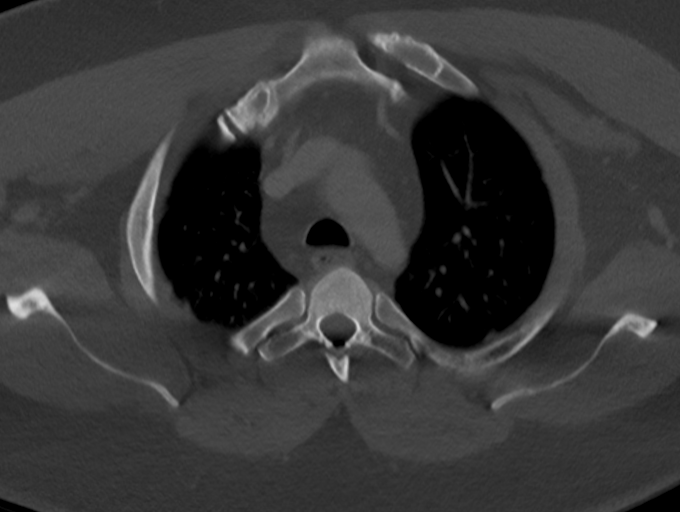
[im 85/170  bone]
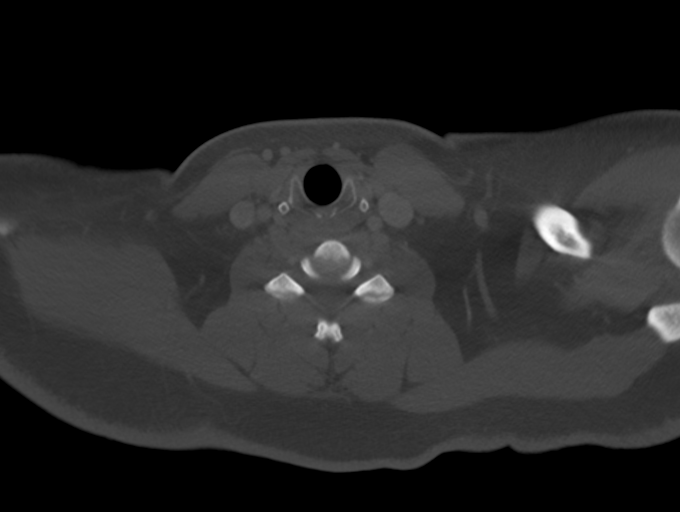
[im 127/170  bone]
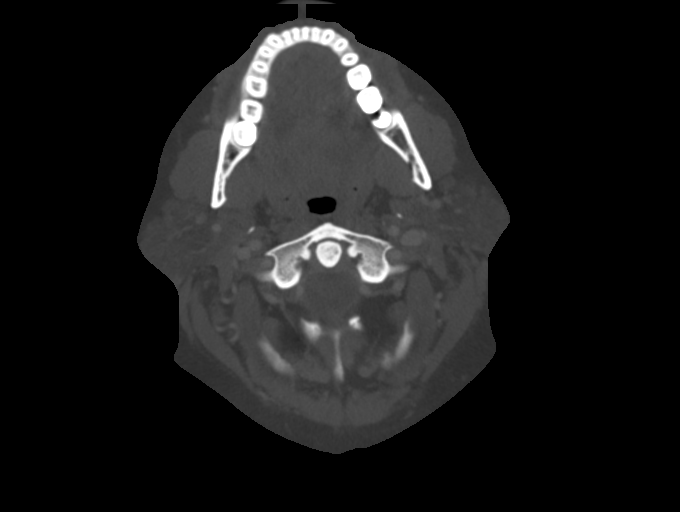

[13 of 33 positions shown; findings below may reference images not displayed]

FINDINGS: Pharynx and larynx: Normal. No mass or swelling.

Salivary glands: No inflammation, mass, or stone.

Thyroid: Normal.

Lymph nodes: None enlarged or abnormal density.

Vascular: Limited assessment due to non arterial timing. Major
arteries in the neck. Grossly patent.

Limited intracranial: Negative.

Visualized orbits: Negative.

Mastoids and visualized paranasal sinuses: Clear.

Skeleton: No evidence of acute abnormality on limited assessment.

Upper chest: Visualized lung apices are clear.

Other: No evidence of mass with attention to the left
sternocleidomastoid region.
IMPRESSION: Unremarkable CT of the neck.  No mass identified.

## 2024-04-30 ENCOUNTER — Other Ambulatory Visit: Payer: Self-pay

## 2024-04-30 ENCOUNTER — Emergency Department (HOSPITAL_BASED_OUTPATIENT_CLINIC_OR_DEPARTMENT_OTHER)
Admission: EM | Admit: 2024-04-30 | Discharge: 2024-04-30 | Disposition: A | Discharge status: Home / Self Care | Attending: Emergency Medicine | Admitting: Emergency Medicine

## 2024-04-30 ENCOUNTER — Encounter (HOSPITAL_BASED_OUTPATIENT_CLINIC_OR_DEPARTMENT_OTHER): Payer: Self-pay | Admitting: Emergency Medicine

## 2024-04-30 DIAGNOSIS — E1165 Type 2 diabetes mellitus with hyperglycemia: Secondary | ICD-10-CM | POA: Insufficient documentation

## 2024-04-30 DIAGNOSIS — I10 Essential (primary) hypertension: Secondary | ICD-10-CM | POA: Diagnosis not present

## 2024-04-30 DIAGNOSIS — Z7984 Long term (current) use of oral hypoglycemic drugs: Secondary | ICD-10-CM | POA: Insufficient documentation

## 2024-04-30 DIAGNOSIS — R739 Hyperglycemia, unspecified: Secondary | ICD-10-CM | POA: Diagnosis present

## 2024-04-30 DIAGNOSIS — Z79899 Other long term (current) drug therapy: Secondary | ICD-10-CM | POA: Insufficient documentation

## 2024-04-30 LAB — BASIC METABOLIC PANEL WITH GFR
Anion gap: 14 (ref 5–15)
BUN: 13 mg/dL (ref 6–20)
CO2: 20 mmol/L — ABNORMAL LOW (ref 22–32)
Calcium: 10 mg/dL (ref 8.9–10.3)
Chloride: 102 mmol/L (ref 98–111)
Creatinine, Ser: 1.15 mg/dL (ref 0.61–1.24)
GFR, Estimated: >60 mL/min (ref >60)
Glucose, Bld: 323 mg/dL — ABNORMAL HIGH (ref 70–99)
Potassium: 4.5 mmol/L (ref 3.5–5.1)
Sodium: 135 mmol/L (ref 135–145)

## 2024-04-30 LAB — URINALYSIS, ROUTINE W REFLEX MICROSCOPIC
Bilirubin Urine: NEGATIVE
Glucose, UA: >1000 mg/dL — AB
Hgb urine dipstick: NEGATIVE
Ketones, ur: NEGATIVE mg/dL
Leukocytes,Ua: NEGATIVE
Nitrite: NEGATIVE
Protein, ur: NEGATIVE mg/dL
Specific Gravity, Urine: 1.025 (ref 1.005–1.030)
pH: 5.5 (ref 5.0–8.0)

## 2024-04-30 LAB — CBC
HCT: 42.4 % (ref 39.0–52.0)
Hemoglobin: 14.8 g/dL (ref 13.0–17.0)
MCH: 30.1 pg (ref 26.0–34.0)
MCHC: 34.9 g/dL (ref 30.0–36.0)
MCV: 86.4 fL (ref 80.0–100.0)
Platelets: 400 10*3/uL (ref 150–400)
RBC: 4.91 MIL/uL (ref 4.22–5.81)
RDW: 12.6 % (ref 11.5–15.5)
WBC: 8.5 10*3/uL (ref 4.0–10.5)
nRBC: 0 % (ref 0.0–0.2)

## 2024-04-30 LAB — CBG MONITORING, ED: Glucose-Capillary: 323 mg/dL — ABNORMAL HIGH (ref 70–99)

## 2024-04-30 NOTE — Discharge Instructions (Signed)
 It was a pleasure caring for you today in the emergency department.  Be sure to drink plenty of water over the next few days, get plenty of rest.  Be sure to follow a diabetic diet.  Follow-up with your PCP next week for recheck and evaluation for medication management  Please return to the emergency department for any worsening or worrisome symptoms.

## 2024-04-30 NOTE — ED Triage Notes (Signed)
 450 cbg at home. Nausea, brain fog. Denies V/D, CP, SOB. Takes metformin and glipizide.

## 2024-04-30 NOTE — ED Provider Notes (Signed)
 [facility] EMERGENCY DEPARTMENT AT [facility] Provider Note  CSN: [id number] Arrival date & time: 04/30/24 1730  Chief Complaint(s) Hyperglycemia  HPI Jeffrey Collier is a 38 y.o. male with past medical history as below, significant for DM, HLD, HTN, obesity who presents to the ED with complaint of elevated glucose  He has been compliant with his diabetes medications.  Notes his blood sugar was elevated at home, over 400.  He has no polydipsia but does have polyuria which is unchanged from baseline.  No nausea or vomiting.  Does report some brain fog for the past few days but no significant headaches or fevers or confusion.  No dyspnea.  Past Medical History Past Medical History:  Diagnosis Date   Diabetes mellitus without complication (HCC)    High cholesterol    Hypertension    There are no active problems to display for this patient.  Home Medication(s) Prior to Admission medications   Medication Sig Start Date End Date Taking? Authorizing Provider  atorvastatin (LIPITOR) 10 MG tablet Take 1 tablet by mouth daily.  02/19/18   [provider]  HYDROcodone -acetaminophen  (NORCO/VICODIN) 5-325 MG tablet Take one tab po q 4 hrs prn pain 04/17/20   Triplett, Tammy, PA-C  lisinopril-hydrochlorothiazide (PRINZIDE,ZESTORETIC) 20-25 MG tablet Take 1 tablet by mouth daily.    [provider]  metFORMIN (GLUCOPHAGE-XR) 500 MG 24 hr tablet Take 1,000 mg by mouth daily with breakfast.     [provider]  metoprolol tartrate (LOPRESSOR) 50 MG tablet Take 50 mg by mouth daily.     [provider]  omeprazole  (PRILOSEC) 20 MG capsule Take 1 capsule (20 mg total) by mouth daily. 03/31/21   Palumbo, April, MD  pantoprazole (PROTONIX) 40 MG tablet Take 1 tablet by mouth daily. 06/24/18   [provider]                                                                                                                                    Past Surgical  History Past Surgical History:  Procedure Laterality Date   ANKLE SURGERY     Family History Family History  Problem Relation Age of Onset   Heart attack Mother    Prostate cancer Father     Social History Social History   Tobacco Use   Smoking status: Never   Smokeless tobacco: Current    Types: Chew  Vaping Use   Vaping status: Never Used  Substance Use Topics   Alcohol use: No   Drug use: No   Allergies Sulfamethoxazole -trimethoprim   Review of Systems A thorough review of systems was obtained and all systems are negative except as noted in the HPI and PMH.   Physical Exam Vital Signs  I have reviewed the triage vital signs BP (!) 140/93 (BP Location: Right Arm)   Pulse 92   Temp 98 F (36.7 C) (Oral)   Resp 18   SpO2  100%  Physical Exam Vitals and nursing note reviewed.  Constitutional:      General: He is not in acute distress.    Appearance: Normal appearance. He is well-developed. He is not ill-appearing.  HENT:     Head: Normocephalic and atraumatic.     Right Ear: External ear normal.     Left Ear: External ear normal.     Nose: Nose normal.     Mouth/Throat:     Mouth: Mucous membranes are moist.  Eyes:     General: No scleral icterus.       Right eye: No discharge.        Left eye: No discharge.  Cardiovascular:     Rate and Rhythm: Normal rate.  Pulmonary:     Effort: Pulmonary effort is normal. No respiratory distress.     Breath sounds: No stridor.  Abdominal:     General: Abdomen is flat. There is no distension.     Tenderness: There is no guarding.  Musculoskeletal:        General: No deformity.     Cervical back: No rigidity.  Skin:    Coloration: Skin is not cyanotic, jaundiced or pale.  Neurological:     Mental Status: He is alert and oriented to person, place, and time.     GCS: GCS eye subscore is 4. GCS verbal subscore is 5. GCS motor subscore is 6.     Gait: Gait is intact.  Psychiatric:        Speech: Speech normal.         Behavior: Behavior normal. Behavior is cooperative.     ED Results and Treatments Labs (all labs ordered are listed, but only abnormal results are displayed) Labs Reviewed  BASIC METABOLIC PANEL WITH GFR - Abnormal; Notable for the following components:      Result Value   CO2 20 (*)    Glucose, Bld 323 (*)    All other components within normal limits  URINALYSIS, ROUTINE W REFLEX MICROSCOPIC - Abnormal; Notable for the following components:   Glucose, UA >1,000 (*)    All other components within normal limits  CBG MONITORING, ED - Abnormal; Notable for the following components:   Glucose-Capillary 323 (*)    All other components within normal limits  CBC                                                                                                                          Radiology No results found.  Pertinent labs & imaging results that were available during my care of the patient were reviewed by me and considered in my medical decision making (see MDM for details).  Medications Ordered in ED Medications - No data to display  Procedures Procedures  (including critical care time)  Medical Decision Making / ED Course    Medical Decision Making:    Jeffrey Collier is a 38 y.o. male with past medical history as below, significant for DM, HLD, HTN, obesity who presents to the ED with complaint of elevated glucose. The complaint involves an extensive differential diagnosis and also carries with it a high risk of complications and morbidity.  Serious etiology was considered. Ddx includes but is not limited to: DKA, HHS, hyperglycemia, hydration, etc.  Complete initial physical exam performed, notably the patient was in no distress, sitting comfortably in stretcher.    Reviewed and confirmed nursing documentation for past medical history, family  history, social history.  Vital signs reviewed.     Brief summary:  Hyperglycemia> -Glucose is elevated, does not appear to have evidence of DKA or HHS. -Urinalysis without ketones - Ambulatory, tolerating p.o. intake, no vomiting, - Encouraged diabetic diet, encourage close follow-up with PCP regarding diabetes management  Patient in no distress and overall condition is stable. Detailed discussions were had with the patient/guardian regarding current findings, and need for close f/u with PCP or on call doctor. The patient/guardian has been instructed to return immediately if the symptoms worsen in any way for re-evaluation. Patient/guardian verbalized understanding and is in agreement with current care plan. All questions answered prior to discharge.                     Additional history obtained: -Additional history obtained from na -External records from outside source obtained and reviewed including: Chart review including previous notes, labs, imaging, consultation notes including  Home medications   Lab Tests: -I ordered, reviewed, and interpreted labs.   The pertinent results include:   Labs Reviewed  BASIC METABOLIC PANEL WITH GFR - Abnormal; Notable for the following components:      Result Value   CO2 20 (*)    Glucose, Bld 323 (*)    All other components within normal limits  URINALYSIS, ROUTINE W REFLEX MICROSCOPIC - Abnormal; Notable for the following components:   Glucose, UA >1,000 (*)    All other components within normal limits  CBG MONITORING, ED - Abnormal; Notable for the following components:   Glucose-Capillary 323 (*)    All other components within normal limits  CBC    Notable for elevated glucose, no DKA  EKG   EKG Interpretation Date/Time:    Ventricular Rate:    PR Interval:    QRS Duration:    QT Interval:    QTC Calculation:   R Axis:      Text Interpretation:           Imaging Studies ordered: na   Medicines  ordered and prescription drug management: No orders of the defined types were placed in this encounter.   -I have reviewed the patients home medicines and have made adjustments as needed   Consultations Obtained: na   Cardiac Monitoring: Continuous pulse oximetry interpreted by myself, 100% on ra.    Social Determinants of Health:  Diagnosis or treatment significantly limited by social determinants of health: obesity   Reevaluation: After the interventions noted above, I reevaluated the patient and found that they have stayed the same  Co morbidities that complicate the patient evaluation  Past Medical History:  Diagnosis Date   Diabetes mellitus without complication (HCC)    High cholesterol    Hypertension       Dispostion: Disposition decision including need  for hospitalization was considered, and patient discharged from emergency department.    Final Clinical Impression(s) / ED Diagnoses Final diagnoses:  Poorly controlled diabetes mellitus (HCC)        Elnor Jayson LABOR, DO 05/01/24 1617

## 2024-04-30 NOTE — ED Notes (Signed)
 Pt here with concerns of hyperglycemia. Pt takes PO meds at home for his blood sugar. Pt stated his blood sugar is still high even after taking home meds.
